# Patient Record
Sex: Male | Born: 1955 | Race: White | Hispanic: No | Marital: Married | Smoking: Never smoker
Health system: Southern US, Community
[De-identification: ages and names within clinical notes are randomized; demographics above are authoritative.]

## PROBLEM LIST (undated history)

## (undated) DIAGNOSIS — M5416 Radiculopathy, lumbar region: Secondary | ICD-10-CM

## (undated) DIAGNOSIS — R945 Abnormal results of liver function studies: Secondary | ICD-10-CM

## (undated) DIAGNOSIS — B351 Tinea unguium: Secondary | ICD-10-CM

## (undated) DIAGNOSIS — E785 Hyperlipidemia, unspecified: Secondary | ICD-10-CM

## (undated) DIAGNOSIS — R897 Abnormal histological findings in specimens from other organs, systems and tissues: Secondary | ICD-10-CM

## (undated) DIAGNOSIS — C801 Malignant (primary) neoplasm, unspecified: Secondary | ICD-10-CM

## (undated) DIAGNOSIS — R7989 Other specified abnormal findings of blood chemistry: Secondary | ICD-10-CM

## (undated) DIAGNOSIS — A6000 Herpesviral infection of urogenital system, unspecified: Secondary | ICD-10-CM

## (undated) DIAGNOSIS — K219 Gastro-esophageal reflux disease without esophagitis: Secondary | ICD-10-CM

## (undated) DIAGNOSIS — H8110 Benign paroxysmal vertigo, unspecified ear: Secondary | ICD-10-CM

## (undated) DIAGNOSIS — M7660 Achilles tendinitis, unspecified leg: Secondary | ICD-10-CM

## (undated) HISTORY — DX: Achilles tendinitis, unspecified leg: M76.60

## (undated) HISTORY — DX: Benign paroxysmal vertigo, unspecified ear: H81.10

## (undated) HISTORY — DX: Abnormal histological findings in specimens from other organs, systems and tissues: R89.7

## (undated) HISTORY — DX: Tinea unguium: B35.1

## (undated) HISTORY — DX: Abnormal results of liver function studies: R94.5

## (undated) HISTORY — PX: APPENDECTOMY: SHX54

## (undated) HISTORY — DX: Radiculopathy, lumbar region: M54.16

## (undated) HISTORY — DX: Other specified abnormal findings of blood chemistry: R79.89

## (undated) HISTORY — DX: Herpesviral infection of urogenital system, unspecified: A60.00

---

## 2007-10-25 DIAGNOSIS — H8110 Benign paroxysmal vertigo, unspecified ear: Secondary | ICD-10-CM

## 2007-10-25 HISTORY — DX: Benign paroxysmal vertigo, unspecified ear: H81.10

## 2009-01-23 ENCOUNTER — Emergency Department (HOSPITAL_BASED_OUTPATIENT_CLINIC_OR_DEPARTMENT_OTHER): Admission: EM | Admit: 2009-01-23 | Discharge: 2009-01-23 | Payer: Self-pay | Admitting: Emergency Medicine

## 2016-01-25 DIAGNOSIS — M5416 Radiculopathy, lumbar region: Secondary | ICD-10-CM | POA: Diagnosis not present

## 2016-01-25 DIAGNOSIS — M5126 Other intervertebral disc displacement, lumbar region: Secondary | ICD-10-CM | POA: Diagnosis not present

## 2016-02-02 DIAGNOSIS — M5126 Other intervertebral disc displacement, lumbar region: Secondary | ICD-10-CM | POA: Diagnosis not present

## 2016-02-02 DIAGNOSIS — M5416 Radiculopathy, lumbar region: Secondary | ICD-10-CM | POA: Diagnosis not present

## 2016-03-16 DIAGNOSIS — L821 Other seborrheic keratosis: Secondary | ICD-10-CM | POA: Diagnosis not present

## 2016-03-16 DIAGNOSIS — Z85828 Personal history of other malignant neoplasm of skin: Secondary | ICD-10-CM | POA: Diagnosis not present

## 2016-03-16 DIAGNOSIS — L814 Other melanin hyperpigmentation: Secondary | ICD-10-CM | POA: Diagnosis not present

## 2016-05-20 DIAGNOSIS — J301 Allergic rhinitis due to pollen: Secondary | ICD-10-CM | POA: Diagnosis not present

## 2016-05-20 DIAGNOSIS — J029 Acute pharyngitis, unspecified: Secondary | ICD-10-CM | POA: Diagnosis not present

## 2016-07-15 DIAGNOSIS — Z23 Encounter for immunization: Secondary | ICD-10-CM | POA: Diagnosis not present

## 2016-07-15 DIAGNOSIS — Z0000 Encounter for general adult medical examination without abnormal findings: Secondary | ICD-10-CM | POA: Diagnosis not present

## 2016-07-15 DIAGNOSIS — Z125 Encounter for screening for malignant neoplasm of prostate: Secondary | ICD-10-CM | POA: Diagnosis not present

## 2016-07-28 ENCOUNTER — Ambulatory Visit: Payer: Self-pay | Admitting: Allergy

## 2016-08-03 DIAGNOSIS — J029 Acute pharyngitis, unspecified: Secondary | ICD-10-CM | POA: Diagnosis not present

## 2016-08-03 DIAGNOSIS — R1313 Dysphagia, pharyngeal phase: Secondary | ICD-10-CM | POA: Diagnosis not present

## 2016-09-01 ENCOUNTER — Ambulatory Visit: Payer: Self-pay | Admitting: Allergy

## 2016-09-02 ENCOUNTER — Other Ambulatory Visit: Payer: Self-pay | Admitting: Gastroenterology

## 2016-11-07 DIAGNOSIS — M5416 Radiculopathy, lumbar region: Secondary | ICD-10-CM | POA: Diagnosis not present

## 2016-11-07 DIAGNOSIS — M545 Low back pain: Secondary | ICD-10-CM | POA: Diagnosis not present

## 2016-11-07 DIAGNOSIS — M5126 Other intervertebral disc displacement, lumbar region: Secondary | ICD-10-CM | POA: Diagnosis not present

## 2016-11-07 DIAGNOSIS — M47816 Spondylosis without myelopathy or radiculopathy, lumbar region: Secondary | ICD-10-CM | POA: Diagnosis not present

## 2016-11-15 ENCOUNTER — Ambulatory Visit (HOSPITAL_COMMUNITY)
Admission: RE | Admit: 2016-11-15 | Discharge: 2016-11-15 | Disposition: A | Discharge status: Home / Self Care | Payer: BLUE CROSS/BLUE SHIELD | Source: Ambulatory Visit | Attending: Gastroenterology | Admitting: Gastroenterology

## 2016-11-15 ENCOUNTER — Ambulatory Visit (HOSPITAL_COMMUNITY): Payer: BLUE CROSS/BLUE SHIELD | Admitting: Anesthesiology

## 2016-11-15 ENCOUNTER — Encounter (HOSPITAL_COMMUNITY)
Admission: RE | Disposition: A | Discharge status: Home / Self Care | Payer: Self-pay | Source: Ambulatory Visit | Attending: Gastroenterology

## 2016-11-15 ENCOUNTER — Encounter (HOSPITAL_COMMUNITY): Payer: Self-pay | Admitting: *Deleted

## 2016-11-15 DIAGNOSIS — H8110 Benign paroxysmal vertigo, unspecified ear: Secondary | ICD-10-CM | POA: Diagnosis not present

## 2016-11-15 DIAGNOSIS — Z1211 Encounter for screening for malignant neoplasm of colon: Secondary | ICD-10-CM | POA: Insufficient documentation

## 2016-11-15 HISTORY — PX: COLONOSCOPY WITH PROPOFOL: SHX5780

## 2016-11-15 SURGERY — COLONOSCOPY WITH PROPOFOL
Anesthesia: General

## 2016-11-15 MED ORDER — SODIUM CHLORIDE 0.9 % IV SOLN: INTRAVENOUS | Status: DC

## 2016-11-15 MED ORDER — LACTATED RINGERS IV SOLN
INTRAVENOUS | Status: DC
  Administered 2016-11-15: 1000 mL via INTRAVENOUS

## 2016-11-15 MED ORDER — PROPOFOL 500 MG/50ML IV EMUL
INTRAVENOUS | Status: DC | PRN
  Administered 2016-11-15: 150 ug/kg/min via INTRAVENOUS

## 2016-11-15 MED ORDER — PROPOFOL 500 MG/50ML IV EMUL
INTRAVENOUS | Status: DC | PRN
  Administered 2016-11-15: 50 mg via INTRAVENOUS

## 2016-11-15 MED ORDER — PROPOFOL 10 MG/ML IV BOLUS
INTRAVENOUS | Status: AC
  Filled 2016-11-15: qty 40

## 2016-11-15 SURGICAL SUPPLY — 22 items

## 2016-11-15 NOTE — Anesthesia Preprocedure Evaluation (Addendum)
Anesthesia Evaluation  Patient identified by MRN, date of birth, ID band Patient awake    Reviewed: Allergy & Precautions, NPO status , Patient's Chart, lab work & pertinent test results  Airway Mallampati: II  TM Distance: >3 FB Neck ROM: Full    Dental  (+) Teeth Intact   Pulmonary neg pulmonary ROS,    breath sounds clear to auscultation       Cardiovascular negative cardio ROS   Rhythm:Regular     Neuro/Psych Neuro pain on steroid pulse.  Neuromuscular disease negative psych ROS   GI/Hepatic negative GI ROS, Neg liver ROS,   Endo/Other  negative endocrine ROS  Renal/GU negative Renal ROS  negative genitourinary   Musculoskeletal negative musculoskeletal ROS (+)   Abdominal   Peds negative pediatric ROS (+)  Hematology negative hematology ROS (+)   Anesthesia Other Findings   Reproductive/Obstetrics negative OB ROS                             Anesthesia Physical Anesthesia Plan  ASA: I  Anesthesia Plan: MAC   Post-op Pain Management:    Induction: Intravenous  Airway Management Planned: Natural Airway, Simple Face Mask and Nasal Cannula  Additional Equipment: None  Intra-op Plan:   Post-operative Plan:   Informed Consent: I have reviewed the patients History and Physical, chart, labs and discussed the procedure including the risks, benefits and alternatives for the proposed anesthesia with the patient or authorized representative who has indicated his/her understanding and acceptance.   Dental advisory given  Plan Discussed with: CRNA and Surgeon  Anesthesia Plan Comments:         Anesthesia Quick Evaluation

## 2016-11-15 NOTE — H&P (Signed)
Procedure: Screening colonoscopy. Normal screening colonoscopy was performed on 07/13/2006  History: The patient is a 61 year old male born 03/25/1956. He is scheduled to undergo a screening colonoscopy today  Past medical history: Appendectomy. Lumbar radiculopathy. Benign paroxysmal positional vertigo.  Exam: The patient is alert and lying comfortably on the endoscopy stretcher. Abdomen is soft and nontender to palpation. Lungs are clear to auscultation. Cardiac exam reveals a regular rhythm.  Plan: Proceed with screening colonoscopy

## 2016-11-15 NOTE — Discharge Instructions (Signed)

## 2016-11-15 NOTE — Transfer of Care (Signed)
Immediate Anesthesia Transfer of Care Note  Patient: Cameron Campbell  Procedure(s) Performed: Procedure(s): COLONOSCOPY WITH PROPOFOL (N/A)  Patient Location: Endoscopy Unit  Anesthesia Type:MAC  Level of Consciousness: awake and alert   Airway & Oxygen Therapy: Patient Spontanous Breathing and Patient connected to face mask oxygen  Post-op Assessment: Report given to RN and Post -op Vital signs reviewed and stable  Post vital signs: Reviewed and stable  Last Vitals:  Vitals:   11/15/16 0930  BP: 120/66  Pulse: 64  Resp: 12  Temp: 36.6 C    Last Pain:  Vitals:   11/15/16 0930  TempSrc: Oral         Complications: No apparent anesthesia complications

## 2016-11-15 NOTE — Anesthesia Postprocedure Evaluation (Addendum)
Anesthesia Post Note  Patient: Cameron Campbell  Procedure(s) Performed: Procedure(s) (LRB): COLONOSCOPY WITH PROPOFOL (N/A)  Patient location during evaluation: Endoscopy Anesthesia Type: General Level of consciousness: awake and alert Pain management: pain level controlled Vital Signs Assessment: post-procedure vital signs reviewed and stable Respiratory status: spontaneous breathing, nonlabored ventilation, respiratory function stable and patient connected to nasal cannula oxygen Cardiovascular status: blood pressure returned to baseline and stable Postop Assessment: no signs of nausea or vomiting Anesthetic complications: no       Last Vitals:  Vitals:   11/15/16 0930 11/15/16 1110  BP: 120/66 131/76  Pulse: 64 83  Resp: 12 19  Temp: 36.6 C     Last Pain:  Vitals:   11/15/16 0930  TempSrc: Oral                 Berel Najjar

## 2016-11-15 NOTE — Op Note (Signed)
[facility] Patient Name: Cameron Campbell Procedure Date: 11/15/2016 MRN: [id number] Attending MD: Charolett Bumpers , MD Date of Birth: [birth year] CSN: [id number] Age: 61 Admit Type: Outpatient Procedure:                Colonoscopy Indications:              Screening for colorectal malignant neoplasm Providers:                Charolett Bumpers, MD, Omelia Blackwater RN, RN,                            [facility], Technician, Roni Bread,                            CRNA Referring MD:              Medicines:                Propofol per Anesthesia Complications:            No immediate complications. Estimated Blood Loss:     Estimated blood loss: none. Procedure:                Pre-Anesthesia Assessment:                           - Prior to the procedure, a History and Physical                            was performed, and patient medications and                            allergies were reviewed. The patient's tolerance of                            previous anesthesia was also reviewed. The risks                            and benefits of the procedure and the sedation                            options and risks were discussed with the patient.                            All questions were answered, and informed consent                            was obtained. Prior Anticoagulants: The patient has                            taken no previous anticoagulant or antiplatelet                            agents. ASA Grade Assessment: II - A patient with  mild systemic disease. After reviewing the risks                            and benefits, the patient was deemed in                            satisfactory condition to undergo the procedure.                           After obtaining informed consent, the colonoscope                            was passed under direct vision. Throughout the                            procedure, the  patient's blood pressure, pulse, and                            oxygen saturations were monitored continuously. The                            EC-[id number] ([id number] scope was introduced through                            the anus and advanced to the the cecum, identified                            by appendiceal orifice and ileocecal valve. The                            colonoscopy was somewhat difficult due to                            significant looping. The patient tolerated the                            procedure well. The quality of the bowel                            preparation was good. The appendiceal orifice and                            the rectum were photographed. Scope In: 10:32:32 AM Scope Out: 11:00:21 AM Scope Withdrawal Time: 0 hours 7 minutes 24 seconds  Total Procedure Duration: 0 hours 27 minutes 49 seconds  Findings:      The perianal and digital rectal examinations were normal.      The entire examined colon appeared normal. Impression:               - The entire examined colon is normal.                           - No specimens collected. Moderate Sedation:      N/A- Per Anesthesia Care Recommendation:           -  Patient has a contact number available for                            emergencies. The signs and symptoms of potential                            delayed complications were discussed with the                            patient. Return to normal activities tomorrow.                            Written discharge instructions were provided to the                            patient.                           - Repeat colonoscopy in 10 years for screening                            purposes.                           - Resume previous diet.                           - Continue present medications. Procedure Code(s):        --- Professional ---                           Z6109, Colorectal cancer screening; colonoscopy on                             individual not meeting criteria for high risk Diagnosis Code(s):        --- Professional ---                           Z12.11, Encounter for screening for malignant                            neoplasm of colon CPT copyright 2016 American Medical Association. All rights reserved. The codes documented in this report are preliminary and upon coder review may  be revised to meet current compliance requirements. Danise Edge, MD Charolett Bumpers, MD 11/15/2016 11:10:46 AM This report has been signed electronically. Number of Addenda: 0

## 2016-11-16 ENCOUNTER — Encounter (HOSPITAL_COMMUNITY): Payer: Self-pay | Admitting: Gastroenterology

## 2016-11-17 DIAGNOSIS — M5416 Radiculopathy, lumbar region: Secondary | ICD-10-CM | POA: Diagnosis not present

## 2016-11-17 DIAGNOSIS — M5126 Other intervertebral disc displacement, lumbar region: Secondary | ICD-10-CM | POA: Diagnosis not present

## 2016-11-17 DIAGNOSIS — M545 Low back pain: Secondary | ICD-10-CM | POA: Diagnosis not present

## 2016-11-22 DIAGNOSIS — M5126 Other intervertebral disc displacement, lumbar region: Secondary | ICD-10-CM | POA: Diagnosis not present

## 2016-11-22 DIAGNOSIS — M545 Low back pain: Secondary | ICD-10-CM | POA: Diagnosis not present

## 2016-11-22 DIAGNOSIS — M5416 Radiculopathy, lumbar region: Secondary | ICD-10-CM | POA: Diagnosis not present

## 2016-12-02 DIAGNOSIS — M545 Low back pain: Secondary | ICD-10-CM | POA: Diagnosis not present

## 2016-12-02 DIAGNOSIS — M5416 Radiculopathy, lumbar region: Secondary | ICD-10-CM | POA: Diagnosis not present

## 2016-12-02 DIAGNOSIS — M5126 Other intervertebral disc displacement, lumbar region: Secondary | ICD-10-CM | POA: Diagnosis not present

## 2016-12-08 DIAGNOSIS — M545 Low back pain: Secondary | ICD-10-CM | POA: Diagnosis not present

## 2016-12-08 DIAGNOSIS — M5126 Other intervertebral disc displacement, lumbar region: Secondary | ICD-10-CM | POA: Diagnosis not present

## 2016-12-08 DIAGNOSIS — M5416 Radiculopathy, lumbar region: Secondary | ICD-10-CM | POA: Diagnosis not present

## 2016-12-14 DIAGNOSIS — M545 Low back pain: Secondary | ICD-10-CM | POA: Diagnosis not present

## 2016-12-14 DIAGNOSIS — M5416 Radiculopathy, lumbar region: Secondary | ICD-10-CM | POA: Diagnosis not present

## 2016-12-14 DIAGNOSIS — M5126 Other intervertebral disc displacement, lumbar region: Secondary | ICD-10-CM | POA: Diagnosis not present

## 2016-12-20 DIAGNOSIS — M545 Low back pain: Secondary | ICD-10-CM | POA: Diagnosis not present

## 2016-12-20 DIAGNOSIS — M5126 Other intervertebral disc displacement, lumbar region: Secondary | ICD-10-CM | POA: Diagnosis not present

## 2016-12-20 DIAGNOSIS — M5416 Radiculopathy, lumbar region: Secondary | ICD-10-CM | POA: Diagnosis not present

## 2017-01-19 DIAGNOSIS — M5126 Other intervertebral disc displacement, lumbar region: Secondary | ICD-10-CM | POA: Diagnosis not present

## 2017-01-19 DIAGNOSIS — M5416 Radiculopathy, lumbar region: Secondary | ICD-10-CM | POA: Diagnosis not present

## 2017-02-06 DIAGNOSIS — M5416 Radiculopathy, lumbar region: Secondary | ICD-10-CM | POA: Diagnosis not present

## 2017-02-06 DIAGNOSIS — M5126 Other intervertebral disc displacement, lumbar region: Secondary | ICD-10-CM | POA: Diagnosis not present

## 2017-02-06 DIAGNOSIS — M545 Low back pain: Secondary | ICD-10-CM | POA: Diagnosis not present

## 2017-03-15 DIAGNOSIS — M5416 Radiculopathy, lumbar region: Secondary | ICD-10-CM | POA: Diagnosis not present

## 2017-03-15 DIAGNOSIS — M545 Low back pain: Secondary | ICD-10-CM | POA: Diagnosis not present

## 2017-03-15 DIAGNOSIS — M5126 Other intervertebral disc displacement, lumbar region: Secondary | ICD-10-CM | POA: Diagnosis not present

## 2017-03-15 DIAGNOSIS — M4807 Spinal stenosis, lumbosacral region: Secondary | ICD-10-CM | POA: Diagnosis not present

## 2017-03-15 DIAGNOSIS — M4120 Other idiopathic scoliosis, site unspecified: Secondary | ICD-10-CM | POA: Diagnosis not present

## 2017-03-27 NOTE — Addendum Note (Signed)
Addendum  created 03/27/17 1020 by Chantel Teti, MD   Sign clinical note    

## 2017-04-03 ENCOUNTER — Other Ambulatory Visit: Payer: Self-pay | Admitting: Neurosurgery

## 2017-04-03 DIAGNOSIS — M5416 Radiculopathy, lumbar region: Secondary | ICD-10-CM

## 2017-04-17 ENCOUNTER — Other Ambulatory Visit: Payer: BLUE CROSS/BLUE SHIELD

## 2017-06-29 DIAGNOSIS — H6593 Unspecified nonsuppurative otitis media, bilateral: Secondary | ICD-10-CM | POA: Diagnosis not present

## 2017-06-29 DIAGNOSIS — J301 Allergic rhinitis due to pollen: Secondary | ICD-10-CM | POA: Diagnosis not present

## 2017-07-08 DIAGNOSIS — S43109A Unspecified dislocation of unspecified acromioclavicular joint, initial encounter: Secondary | ICD-10-CM | POA: Diagnosis not present

## 2017-07-19 DIAGNOSIS — Z125 Encounter for screening for malignant neoplasm of prostate: Secondary | ICD-10-CM | POA: Diagnosis not present

## 2017-07-19 DIAGNOSIS — Z23 Encounter for immunization: Secondary | ICD-10-CM | POA: Diagnosis not present

## 2017-07-19 DIAGNOSIS — R7303 Prediabetes: Secondary | ICD-10-CM | POA: Diagnosis not present

## 2017-07-19 DIAGNOSIS — Z0000 Encounter for general adult medical examination without abnormal findings: Secondary | ICD-10-CM | POA: Diagnosis not present

## 2017-08-16 DIAGNOSIS — M545 Low back pain: Secondary | ICD-10-CM | POA: Diagnosis not present

## 2017-08-16 DIAGNOSIS — S233XXA Sprain of ligaments of thoracic spine, initial encounter: Secondary | ICD-10-CM | POA: Diagnosis not present

## 2017-11-09 DIAGNOSIS — F4323 Adjustment disorder with mixed anxiety and depressed mood: Secondary | ICD-10-CM | POA: Diagnosis not present

## 2017-11-16 DIAGNOSIS — H2513 Age-related nuclear cataract, bilateral: Secondary | ICD-10-CM | POA: Diagnosis not present

## 2017-12-07 ENCOUNTER — Encounter: Payer: Self-pay | Admitting: Interventional Cardiology

## 2017-12-07 DIAGNOSIS — R0609 Other forms of dyspnea: Secondary | ICD-10-CM | POA: Diagnosis not present

## 2017-12-07 DIAGNOSIS — M2669 Other specified disorders of temporomandibular joint: Secondary | ICD-10-CM | POA: Diagnosis not present

## 2017-12-07 DIAGNOSIS — E785 Hyperlipidemia, unspecified: Secondary | ICD-10-CM | POA: Diagnosis not present

## 2017-12-08 ENCOUNTER — Other Ambulatory Visit: Payer: Self-pay | Admitting: Internal Medicine

## 2017-12-08 DIAGNOSIS — R0600 Dyspnea, unspecified: Secondary | ICD-10-CM

## 2017-12-08 DIAGNOSIS — R0609 Other forms of dyspnea: Principal | ICD-10-CM

## 2017-12-19 ENCOUNTER — Ambulatory Visit (INDEPENDENT_AMBULATORY_CARE_PROVIDER_SITE_OTHER): Payer: BLUE CROSS/BLUE SHIELD

## 2017-12-19 DIAGNOSIS — R0609 Other forms of dyspnea: Secondary | ICD-10-CM | POA: Diagnosis not present

## 2017-12-19 DIAGNOSIS — R0600 Dyspnea, unspecified: Secondary | ICD-10-CM

## 2017-12-19 LAB — EXERCISE TOLERANCE TEST
CHL CUP MPHR: 158 {beats}/min
CHL CUP RESTING HR STRESS: 65 {beats}/min
CHL RATE OF PERCEIVED EXERTION: 17
CSEPEDS: 0 s
Estimated workload: 10.1 METS
Exercise duration (min): 9 min
Peak HR: 142 {beats}/min
Percent HR: 89 %

## 2018-01-01 ENCOUNTER — Ambulatory Visit: Payer: BLUE CROSS/BLUE SHIELD | Admitting: Cardiology

## 2018-01-18 ENCOUNTER — Encounter: Payer: Self-pay | Admitting: Interventional Cardiology

## 2018-01-18 ENCOUNTER — Encounter (INDEPENDENT_AMBULATORY_CARE_PROVIDER_SITE_OTHER): Payer: Self-pay

## 2018-01-18 ENCOUNTER — Ambulatory Visit: Payer: BLUE CROSS/BLUE SHIELD | Admitting: Interventional Cardiology

## 2018-01-18 VITALS — BP 92/60 | HR 67 | Ht 71.0 in | Wt 171.8 lb

## 2018-01-18 DIAGNOSIS — E782 Mixed hyperlipidemia: Secondary | ICD-10-CM

## 2018-01-18 DIAGNOSIS — R9439 Abnormal result of other cardiovascular function study: Secondary | ICD-10-CM

## 2018-01-18 NOTE — Patient Instructions (Signed)

## 2018-01-18 NOTE — Progress Notes (Signed)
Cardiology Office Note   Date:  01/18/2018   ID:  Cameron Spitzimothy J Heiss, DOB 06/11/1956, MRN [id number]  PCP:  Lorenda IshiharaVaradarajan, Rupashree, MD    No chief complaint on file.  abnormal stress test  Wt Readings from Last 3 Encounters:  01/18/18 171 lb 12.8 oz (77.9 kg)  11/15/16 180 lb (81.6 kg)       History of Present Illness: Cameron Campbell is a 62 y.o. male who is being seen today for the evaluation of abnormal stress test at the request of Lorenda IshiharaVaradarajan, Rupashree,*.  He had some left jaw pain after a skiing trip in [place].  He thinks he fell on his face prior to the pain starting.  He came back and saw his PMD.  His lipids were checked and his cholesterol had increased.  This prompted a treadmill test which was abnormal.    Currently, he feels well.  No further jaw pain.  Denies : Chest pain. Dizziness. Leg edema. Nitroglycerin use. Orthopnea. Palpitations. Paroxysmal nocturnal dyspnea. Shortness of breath. Syncope.   He has occasional leg cramps.    He walks daily, about 2 miles, at least 3 x/week.  He walks 10K steps/day.  No issues.      Past Medical History:  Diagnosis Date  . Abnormal prostate biopsy    negative bx 2003  . Achilles tendinitis   . BPPV (benign paroxysmal positional vertigo) 10/2007  . Elevated LFTs   . Genital herpes   . Lumbar radiculopathy   . Onychomycosis     Past Surgical History:  Procedure Laterality Date  . APPENDECTOMY    . COLONOSCOPY WITH PROPOFOL N/A 11/15/2016   Procedure: COLONOSCOPY WITH PROPOFOL;  Surgeon: Charolett BumpersMartin K Johnson, MD;  Location: [facility];  Service: Endoscopy;  Laterality: N/A;     Current Outpatient Medications  Medication Sig Dispense Refill  . atorvastatin (LIPITOR) 10 MG tablet Take 10 mg by mouth daily.  1  . Multiple Vitamin (MULTIVITAMIN WITH MINERALS) TABS tablet Take 1 tablet by mouth daily.    . predniSONE (STERAPRED UNI-PAK 48 TAB) 10 MG (48) TBPK tablet Take 10 tablets by mouth daily.     No current  facility-administered medications for this visit.     Allergies:   Patient has no known allergies.    Social History:  The patient  reports that he has never smoked. He has never used smokeless tobacco. He reports that he drinks alcohol. He reports that he does not use drugs.   Family History:  The patient's family history includes AAA (abdominal aortic aneurysm) in his father; CAD in his father; Crohn's disease in his brother and brother; Healthy in his brother, brother, sister, and sister; Heart attack in his father; Myelodysplastic syndrome in his mother; Non-Hodgkin's lymphoma in his mother; Other in his father.    ROS:  Please see the history of present illness.   Otherwise, review of systems are positive for leg cramps.   All other systems are reviewed and negative.    PHYSICAL EXAM: VS:  BP 92/60   Pulse 67   Ht 5\' 11"  (1.803 m)   Wt 171 lb 12.8 oz (77.9 kg)   SpO2 98%   BMI 23.96 kg/m  , BMI Body mass index is 23.96 kg/m. GEN: Well nourished, well developed, in no acute distress  HEENT: normal  Neck: no JVD, carotid bruits, or masses Cardiac: RRR; no murmurs, rubs, or gallops,no edema ; 2+ posterior tibial pulses bilaterally Respiratory:  clear to auscultation  bilaterally, normal work of breathing GI: soft, nontender, nondistended, + BS MS: no deformity or atrophy  Skin: warm and dry, no rash Neuro:  Strength and sensation are intact Psych: euthymic mood, full affect   EKG:   The ekg ordered 12/07/17 demonstrates normal ECG   Recent Labs: No results found for requested labs within last 8760 hours.   Lipid Panel No results found for: CHOL, TRIG, HDL, CHOLHDL, VLDL, LDLCALC, LDLDIRECT   Other studies Reviewed: Additional studies/ records that were reviewed today with results demonstrating: stress test reviewed.  Lipids reviewed   ASSESSMENT AND PLAN:  1. Abnormal stress test: Upsloping ST depression noted.  THis may not be a sign of ischemia, as it was 9  minutes on the treadmill without sx.  No downsloping ST depression noted.  He has not had any further sx.  Jaw pain has resolved.  No exertional sx.  We discussed cath and nuclear stress as options.  Since he feels better, he prefers to hold off and see if any symptoms occur.  I think this is reasonable.  We did talk about modifying risk factors including lipid-lowering therapy, improving his diet and continuing regular exercise. 2. Hyperlipidemia: LDL 166.  Would like to see it < 130.  COntinue atorvastatin. 3. Family history of coronary artery disease.  Father with prior vascular disease. 4. He will let us know if there is any change in his symptoms.  I did explain to him that if he had symptoms concerning for ischemia, we would either schedule a nuclear stress test or potentially an angiogram.  This would be done quickly if he had any symptoms.   Current medicines are reviewed at length with the patient today.  The patient concerns regarding his medicines were addressed.  The following changes have been made:  No change  Labs/ tests ordered today include:  No orders of the defined types were placed in this encounter.   Recommend 150 minutes/week of aerobic exercise Low fat, low carb, high fiber diet recommended  Disposition:   FU in 6 months   Signed, Lance Muss, MD  01/18/2018 9:24 AM    [facility] [address] [place], [place], [place]  [address] Phone: [phone]; Fax: [phone]

## 2018-02-07 ENCOUNTER — Ambulatory Visit: Payer: BLUE CROSS/BLUE SHIELD | Admitting: Cardiovascular Disease

## 2018-03-06 DIAGNOSIS — S83241A Other tear of medial meniscus, current injury, right knee, initial encounter: Secondary | ICD-10-CM | POA: Diagnosis not present

## 2018-07-02 DIAGNOSIS — J309 Allergic rhinitis, unspecified: Secondary | ICD-10-CM | POA: Diagnosis not present

## 2018-07-02 DIAGNOSIS — R498 Other voice and resonance disorders: Secondary | ICD-10-CM | POA: Diagnosis not present

## 2018-07-02 DIAGNOSIS — Z0001 Encounter for general adult medical examination with abnormal findings: Secondary | ICD-10-CM | POA: Diagnosis not present

## 2018-07-02 DIAGNOSIS — F4320 Adjustment disorder, unspecified: Secondary | ICD-10-CM | POA: Diagnosis not present

## 2018-07-11 DIAGNOSIS — Z136 Encounter for screening for cardiovascular disorders: Secondary | ICD-10-CM | POA: Diagnosis not present

## 2018-07-11 DIAGNOSIS — Z0000 Encounter for general adult medical examination without abnormal findings: Secondary | ICD-10-CM | POA: Diagnosis not present

## 2018-07-12 DIAGNOSIS — R040 Epistaxis: Secondary | ICD-10-CM | POA: Diagnosis not present

## 2018-07-19 ENCOUNTER — Ambulatory Visit: Payer: BLUE CROSS/BLUE SHIELD | Admitting: Interventional Cardiology

## 2018-07-20 DIAGNOSIS — Z125 Encounter for screening for malignant neoplasm of prostate: Secondary | ICD-10-CM | POA: Diagnosis not present

## 2018-07-20 DIAGNOSIS — Z0000 Encounter for general adult medical examination without abnormal findings: Secondary | ICD-10-CM | POA: Diagnosis not present

## 2018-07-20 DIAGNOSIS — Z23 Encounter for immunization: Secondary | ICD-10-CM | POA: Diagnosis not present

## 2018-07-20 DIAGNOSIS — E785 Hyperlipidemia, unspecified: Secondary | ICD-10-CM | POA: Diagnosis not present

## 2018-08-20 ENCOUNTER — Ambulatory Visit: Payer: BLUE CROSS/BLUE SHIELD | Admitting: Cardiology

## 2018-08-20 ENCOUNTER — Encounter: Payer: Self-pay | Admitting: Cardiology

## 2018-08-20 VITALS — BP 116/72 | HR 68 | Ht 71.0 in | Wt 181.0 lb

## 2018-08-20 DIAGNOSIS — R9439 Abnormal result of other cardiovascular function study: Secondary | ICD-10-CM | POA: Diagnosis not present

## 2018-08-20 NOTE — Patient Instructions (Addendum)
  Medication Instructions:  none If you need a refill on your cardiac medications before your next appointment, please call your pharmacy.   Lab work: none If you have labs (blood work) drawn today and your tests are completely normal, you will receive your results only by: Marland Kitchen MyChart Message (if you have MyChart) OR . A paper copy in the mail If you have any lab test that is abnormal or we need to change your treatment, we will call you to review the results.  Testing/Procedures: none  Follow-Up: At [facility], you and your health needs are our priority.  As part of our continuing mission to provide you with exceptional heart care, we have created designated Provider Care Teams.  These Care Teams include your primary Cardiologist (physician) and Advanced Practice Providers (APPs -  Physician Assistants and Nurse Practitioners) who all work together to provide you with the care you need, when you need it. You will need a follow up appointment as needed. Please call our office 2 months in advance to schedule this appointment.  You may see Dr Eldridge Dace or one of the following Advanced Practice Providers on your designated Care Team:   Robbie Lis, PA-C Ronie Spies, PA-C . Jacolyn Reedy, PA-C  Any Other Special Instructions Will Be Listed Below (If Applicable).

## 2018-08-20 NOTE — Progress Notes (Signed)
08/20/2018 Cameron Campbell   11-Jul-1956  [id number]  Primary Physician Lorenda Ishihara, MD Primary Cardiologist: Dr. Eldridge Dace   Reason for Visit/CC: f/u for abnormal Exercise stress test.   HPI:  Cameron Campbell is a 62 y.o. male who is being seen today for f/u.   He was initially referred to Dr. Eldridge Dace in March of this year by his PCP for evaluation for abnormal stress test.  Records indicate that he had developed left jaw pain during a skiing trip in [place].  The patient had recalled that he had fell on his face prior to the pain starting.  When he returned home, he set up an appointment with his PCP.  His lipids were checked and his cholesterol had increased.  This prompted a treadmill test which was interpreted by PCP to be abnormal.  He was referred to Dr. Eldridge Dace for further evaluation.  His EKGs were reviewed and demonstrated up sloping ST depressions.  Dr. Eldridge Dace did not feel that this was a sign of ischemia as he was able to exercise for more than 9 minutes on the treadmill without symptoms.  There was no downsloping ST depressions noted.  When the patient was seen in clinic, he had denied any further symptoms.  His jaw pain had resolved and he denied any exertional chest pain or dyspnea.  Dr. Eldridge Dace discussed cardiac catheterization and nuclear stress test as options, however since the patient was feeling better at that time he noted that he would prefer to hold off and see if any symptoms would occur.  Dr. Eldridge Dace felt that this was reasonable.  He did recommend risk factor modification including lipid-lowering therapy to reduce LDL.  His LDL was noted to be 166 mg/dL.  Dr. Eldridge Dace stated that he would like to see this less than 130 mg/dL.  He presents back to clinic today for follow-up. He has done well. He has not had any recurrent jaw pain and no CP or exertional dyspnea. He walks 2 miles a day w/o exertional symptoms. EKG shows NSR with no ischemic abnormalities. He  is now on a statin, Lipitor, and LDL is now under good control at 91 mg/dL. He does have prediabetes with Hgb A1c at 5.7. He denies tobacco use. BP is well controlled w/o any medications.   Current Meds  Medication Sig  . atorvastatin (LIPITOR) 10 MG tablet Take 10 mg by mouth daily.  . Multiple Vitamin (MULTIVITAMIN WITH MINERALS) TABS tablet Take 1 tablet by mouth daily.   No Known Allergies Past Medical History:  Diagnosis Date  . Abnormal prostate biopsy    negative bx 2003  . Achilles tendinitis   . BPPV (benign paroxysmal positional vertigo) 10/2007  . Elevated LFTs   . Genital herpes   . Lumbar radiculopathy   . Onychomycosis    Family History  Problem Relation Age of Onset  . Non-Hodgkin's lymphoma Mother   . Myelodysplastic syndrome Mother   . Heart attack Father   . AAA (abdominal aortic aneurysm) Father        stented  . CAD Father   . Other Father        pacer  . Healthy Sister   . Healthy Brother   . Healthy Brother   . Crohn's disease Brother   . Crohn's disease Brother   . Healthy Sister    Past Surgical History:  Procedure Laterality Date  . APPENDECTOMY    . COLONOSCOPY WITH PROPOFOL N/A 11/15/2016   Procedure: COLONOSCOPY  WITH PROPOFOL;  Surgeon: Charolett Bumpers, MD;  Location: [facility];  Service: Endoscopy;  Laterality: N/A;   Social History   Socioeconomic History  . Marital status: Married    Spouse name: Not on file  . Number of children: Not on file  . Years of education: Not on file  . Highest education level: Not on file  Occupational History  . Not on file  Social Needs  . Financial resource strain: Not on file  . Food insecurity:    Worry: Not on file    Inability: Not on file  . Transportation needs:    Medical: Not on file    Non-medical: Not on file  Tobacco Use  . Smoking status: Never Smoker  . Smokeless tobacco: Never Used  Substance and Sexual Activity  . Alcohol use: Yes    Comment: occasional  . Drug use: No  .  Sexual activity: Not on file  Lifestyle  . Physical activity:    Days per week: Not on file    Minutes per session: Not on file  . Stress: Not on file  Relationships  . Social connections:    Talks on phone: Not on file    Gets together: Not on file    Attends religious service: Not on file    Active member of club or organization: Not on file    Attends meetings of clubs or organizations: Not on file    Relationship status: Not on file  . Intimate partner violence:    Fear of current or ex partner: Not on file    Emotionally abused: Not on file    Physically abused: Not on file    Forced sexual activity: Not on file  Other Topics Concern  . Not on file  Social History Narrative  . Not on file     Review of Systems: General: negative for chills, fever, night sweats or weight changes.  Cardiovascular: negative for chest pain, dyspnea on exertion, edema, orthopnea, palpitations, paroxysmal nocturnal dyspnea or shortness of breath Dermatological: negative for rash Respiratory: negative for cough or wheezing Urologic: negative for hematuria Abdominal: negative for nausea, vomiting, diarrhea, bright red blood per rectum, melena, or hematemesis Neurologic: negative for visual changes, syncope, or dizziness All other systems reviewed and are otherwise negative except as noted above.   Physical Exam:  Blood pressure 116/72, pulse 68, height 5\' 11"  (1.803 m), weight 181 lb (82.1 kg), SpO2 96 %.  General appearance: alert, cooperative and no distress Neck: no carotid bruit and no JVD Lungs: clear to auscultation bilaterally Heart: regular rate and rhythm, S1, S2 normal, no murmur, click, rub or gallop Extremities: extremities normal, atraumatic, no cyanosis or edema Pulses: 2+ and symmetric Skin: Skin color, texture, turgor normal. No rashes or lesions Neurologic: Grossly normal  EKG NSR 68 bpm no ischemia-- personally reviewed   ASSESSMENT AND PLAN:   [name] has had no  recurrent jaw pain since his ski trip.  In hindsight, he believes that the pain was related to him falling on his face while skiing. His Exercise stress test done in March was not concerning for ischemia as outlined above.  He continues to deny any exertional symptoms.  He is very active. He walks 2 miles a day for exercise and has not experienced any exertional jaw pain, no exertional chest pain or exertional dyspnea.  His cardiac risk factors are controlled.  His LDL is now down to 91 mg/dL on statin therapy.  He  does have prediabetes with last hemoglobin A1c of 5.7.  This will need to be followed by his PCP.  His blood pressure is well controlled without medications and he does not use tobacco.  We discussed early warning signs for coronary artery disease including exertional chest pain and dyspnea.  I have encouraged patient to continue with daily use of statin to keep LDL under good control and also encouraged that he continue to exercise regularly.  He will notify us if he develops any exertional chest pain or dyspnea.  Otherwise, I recommend routine follow-up with his PCP for management of his cardiac risk factors and for surveillance.  He can follow-up with cardiology as needed  Follow-Up PRN  Nekia Maxham Delmer Islam, MHS [facility] 08/20/2018 4:28 PM

## 2019-07-30 DIAGNOSIS — R040 Epistaxis: Secondary | ICD-10-CM | POA: Diagnosis not present

## 2019-10-07 DIAGNOSIS — Z23 Encounter for immunization: Secondary | ICD-10-CM | POA: Diagnosis not present

## 2019-10-31 DIAGNOSIS — Z125 Encounter for screening for malignant neoplasm of prostate: Secondary | ICD-10-CM | POA: Diagnosis not present

## 2019-10-31 DIAGNOSIS — E785 Hyperlipidemia, unspecified: Secondary | ICD-10-CM | POA: Diagnosis not present

## 2019-10-31 DIAGNOSIS — Z79899 Other long term (current) drug therapy: Secondary | ICD-10-CM | POA: Diagnosis not present

## 2019-10-31 DIAGNOSIS — Z0000 Encounter for general adult medical examination without abnormal findings: Secondary | ICD-10-CM | POA: Diagnosis not present

## 2019-11-25 DIAGNOSIS — M5412 Radiculopathy, cervical region: Secondary | ICD-10-CM | POA: Diagnosis not present

## 2019-11-25 DIAGNOSIS — M7581 Other shoulder lesions, right shoulder: Secondary | ICD-10-CM | POA: Diagnosis not present

## 2019-11-25 DIAGNOSIS — M545 Low back pain: Secondary | ICD-10-CM | POA: Diagnosis not present

## 2019-12-02 DIAGNOSIS — M542 Cervicalgia: Secondary | ICD-10-CM | POA: Diagnosis not present

## 2019-12-11 DIAGNOSIS — R030 Elevated blood-pressure reading, without diagnosis of hypertension: Secondary | ICD-10-CM | POA: Diagnosis not present

## 2019-12-11 DIAGNOSIS — M4802 Spinal stenosis, cervical region: Secondary | ICD-10-CM | POA: Diagnosis not present

## 2019-12-11 DIAGNOSIS — M5412 Radiculopathy, cervical region: Secondary | ICD-10-CM | POA: Diagnosis not present

## 2019-12-11 DIAGNOSIS — M5020 Other cervical disc displacement, unspecified cervical region: Secondary | ICD-10-CM | POA: Diagnosis not present

## 2019-12-16 DIAGNOSIS — M5126 Other intervertebral disc displacement, lumbar region: Secondary | ICD-10-CM | POA: Diagnosis not present

## 2019-12-18 DIAGNOSIS — Z1152 Encounter for screening for COVID-19: Secondary | ICD-10-CM | POA: Diagnosis not present

## 2019-12-23 DIAGNOSIS — M4802 Spinal stenosis, cervical region: Secondary | ICD-10-CM | POA: Diagnosis not present

## 2019-12-23 DIAGNOSIS — M5020 Other cervical disc displacement, unspecified cervical region: Secondary | ICD-10-CM | POA: Diagnosis not present

## 2019-12-23 DIAGNOSIS — M5412 Radiculopathy, cervical region: Secondary | ICD-10-CM | POA: Diagnosis not present

## 2019-12-23 DIAGNOSIS — M542 Cervicalgia: Secondary | ICD-10-CM | POA: Diagnosis not present

## 2019-12-24 DIAGNOSIS — M4722 Other spondylosis with radiculopathy, cervical region: Secondary | ICD-10-CM | POA: Diagnosis not present

## 2019-12-24 DIAGNOSIS — M4712 Other spondylosis with myelopathy, cervical region: Secondary | ICD-10-CM | POA: Diagnosis not present

## 2019-12-24 DIAGNOSIS — M50323 Other cervical disc degeneration at C6-C7 level: Secondary | ICD-10-CM | POA: Diagnosis not present

## 2019-12-24 DIAGNOSIS — M50223 Other cervical disc displacement at C6-C7 level: Secondary | ICD-10-CM | POA: Diagnosis not present

## 2019-12-24 DIAGNOSIS — M50123 Cervical disc disorder at C6-C7 level with radiculopathy: Secondary | ICD-10-CM | POA: Diagnosis not present

## 2019-12-24 DIAGNOSIS — M50122 Cervical disc disorder at C5-C6 level with radiculopathy: Secondary | ICD-10-CM | POA: Diagnosis not present

## 2020-01-15 DIAGNOSIS — M5412 Radiculopathy, cervical region: Secondary | ICD-10-CM | POA: Diagnosis not present

## 2020-03-02 DIAGNOSIS — M5020 Other cervical disc displacement, unspecified cervical region: Secondary | ICD-10-CM | POA: Diagnosis not present

## 2020-03-09 DIAGNOSIS — M25561 Pain in right knee: Secondary | ICD-10-CM | POA: Diagnosis not present

## 2020-05-20 DIAGNOSIS — E785 Hyperlipidemia, unspecified: Secondary | ICD-10-CM | POA: Diagnosis not present

## 2020-05-20 DIAGNOSIS — R030 Elevated blood-pressure reading, without diagnosis of hypertension: Secondary | ICD-10-CM | POA: Diagnosis not present

## 2020-06-22 ENCOUNTER — Ambulatory Visit
Admission: RE | Admit: 2020-06-22 | Discharge: 2020-06-22 | Disposition: A | Discharge status: Home / Self Care | Payer: BLUE CROSS/BLUE SHIELD | Source: Ambulatory Visit | Attending: Internal Medicine | Admitting: Internal Medicine

## 2020-06-22 ENCOUNTER — Other Ambulatory Visit: Payer: Self-pay | Admitting: Internal Medicine

## 2020-06-22 DIAGNOSIS — E785 Hyperlipidemia, unspecified: Secondary | ICD-10-CM | POA: Diagnosis not present

## 2020-06-22 DIAGNOSIS — R0789 Other chest pain: Secondary | ICD-10-CM

## 2020-06-22 DIAGNOSIS — R0781 Pleurodynia: Secondary | ICD-10-CM | POA: Diagnosis not present

## 2020-10-26 DIAGNOSIS — Z20822 Contact with and (suspected) exposure to covid-19: Secondary | ICD-10-CM | POA: Diagnosis not present

## 2020-11-06 DIAGNOSIS — E785 Hyperlipidemia, unspecified: Secondary | ICD-10-CM | POA: Diagnosis not present

## 2020-11-06 DIAGNOSIS — J301 Allergic rhinitis due to pollen: Secondary | ICD-10-CM | POA: Diagnosis not present

## 2020-11-06 DIAGNOSIS — Z125 Encounter for screening for malignant neoplasm of prostate: Secondary | ICD-10-CM | POA: Diagnosis not present

## 2020-11-06 DIAGNOSIS — Z0000 Encounter for general adult medical examination without abnormal findings: Secondary | ICD-10-CM | POA: Diagnosis not present

## 2020-11-11 DIAGNOSIS — Z125 Encounter for screening for malignant neoplasm of prostate: Secondary | ICD-10-CM | POA: Diagnosis not present

## 2020-11-11 DIAGNOSIS — Z0000 Encounter for general adult medical examination without abnormal findings: Secondary | ICD-10-CM | POA: Diagnosis not present

## 2020-11-11 DIAGNOSIS — E785 Hyperlipidemia, unspecified: Secondary | ICD-10-CM | POA: Diagnosis not present

## 2020-11-11 DIAGNOSIS — Z1211 Encounter for screening for malignant neoplasm of colon: Secondary | ICD-10-CM | POA: Diagnosis not present

## 2020-11-24 DIAGNOSIS — Z23 Encounter for immunization: Secondary | ICD-10-CM | POA: Diagnosis not present

## 2020-11-25 DIAGNOSIS — M65342 Trigger finger, left ring finger: Secondary | ICD-10-CM | POA: Diagnosis not present

## 2021-02-01 DIAGNOSIS — M65342 Trigger finger, left ring finger: Secondary | ICD-10-CM | POA: Diagnosis not present

## 2021-09-14 DIAGNOSIS — R1030 Lower abdominal pain, unspecified: Secondary | ICD-10-CM | POA: Diagnosis not present

## 2021-09-14 DIAGNOSIS — R351 Nocturia: Secondary | ICD-10-CM | POA: Diagnosis not present

## 2021-09-28 IMAGING — DX DG RIBS W/ CHEST 3+V*L*
3 series · 3 of 3 positions shown · non-contrast
Comparison: 05/19/2011 chest radiograph.

CLINICAL DATA: Tenderness

EXAM:
LEFT RIBS AND CHEST - 3+ VIEW

[dg ribs unilateral w/chest left (1 of 3)]
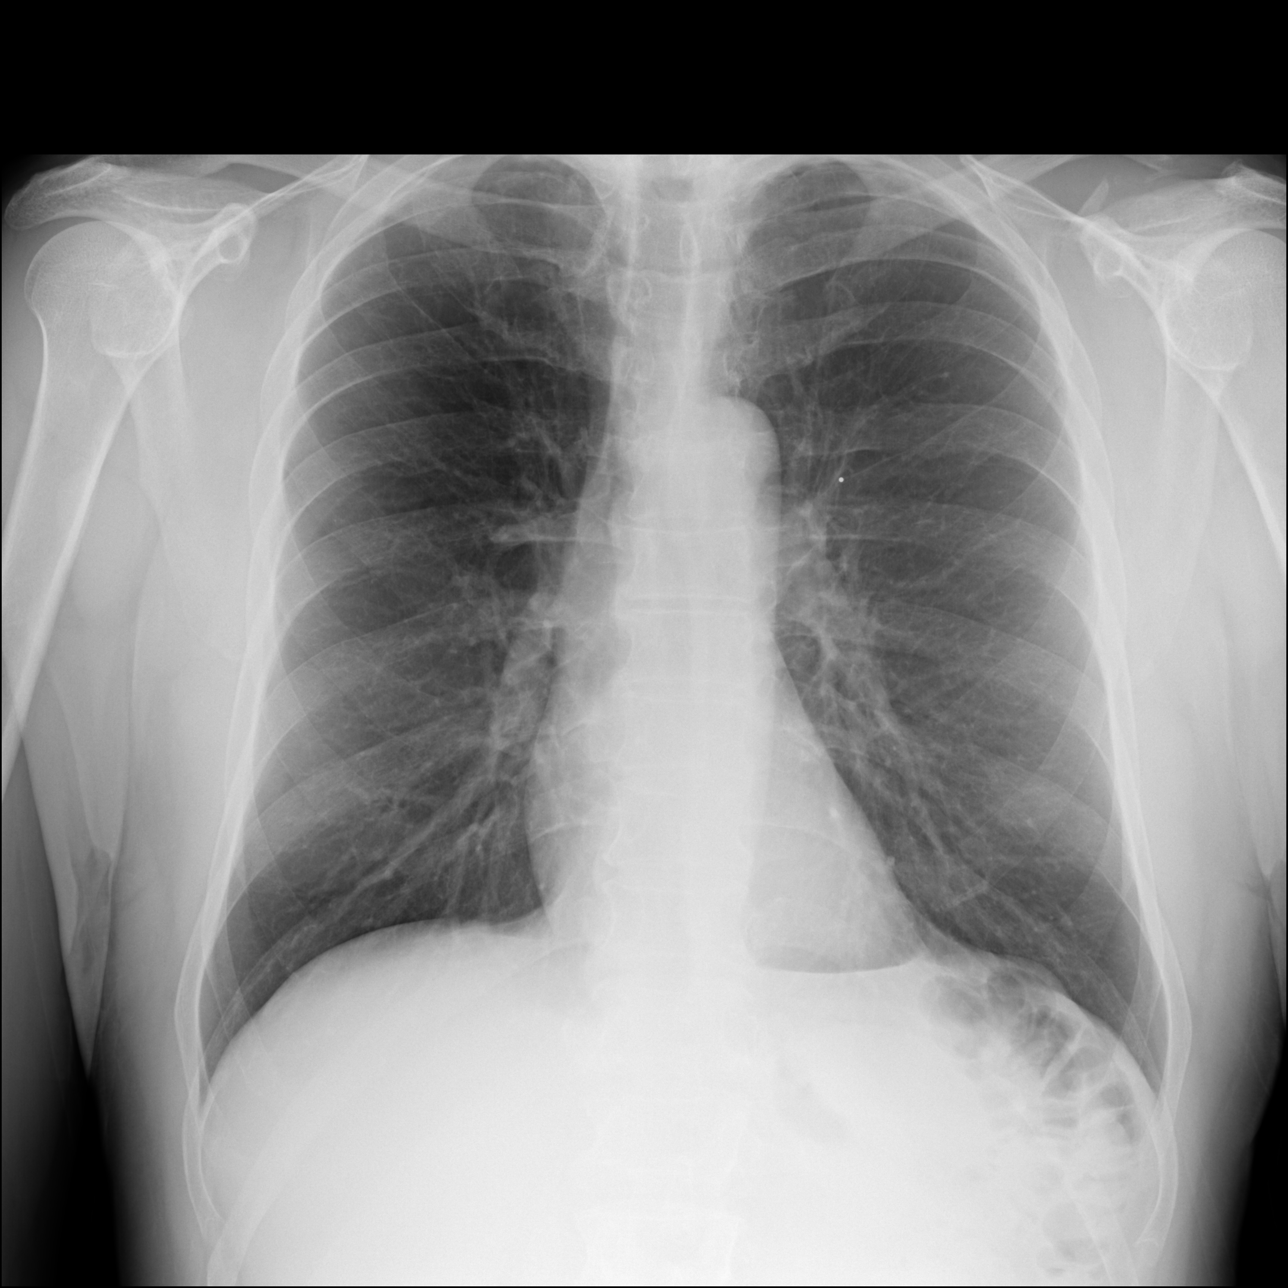

[dg ribs unilateral w/chest left (2 of 3)]
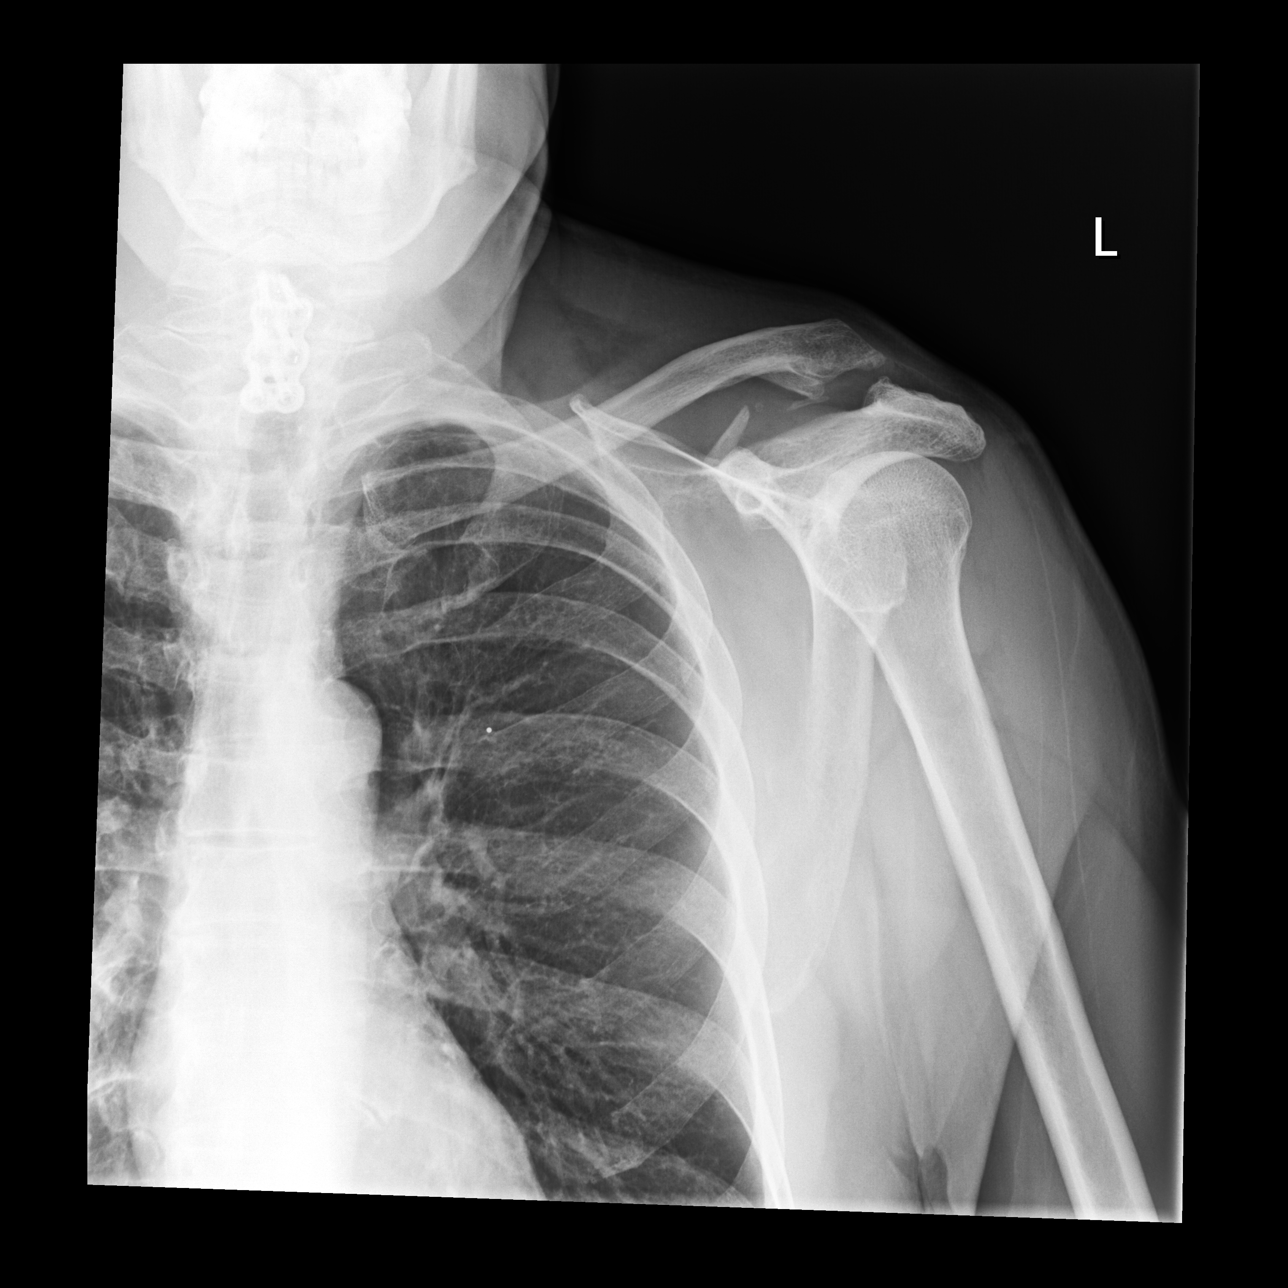

[dg ribs unilateral w/chest left (3 of 3)]
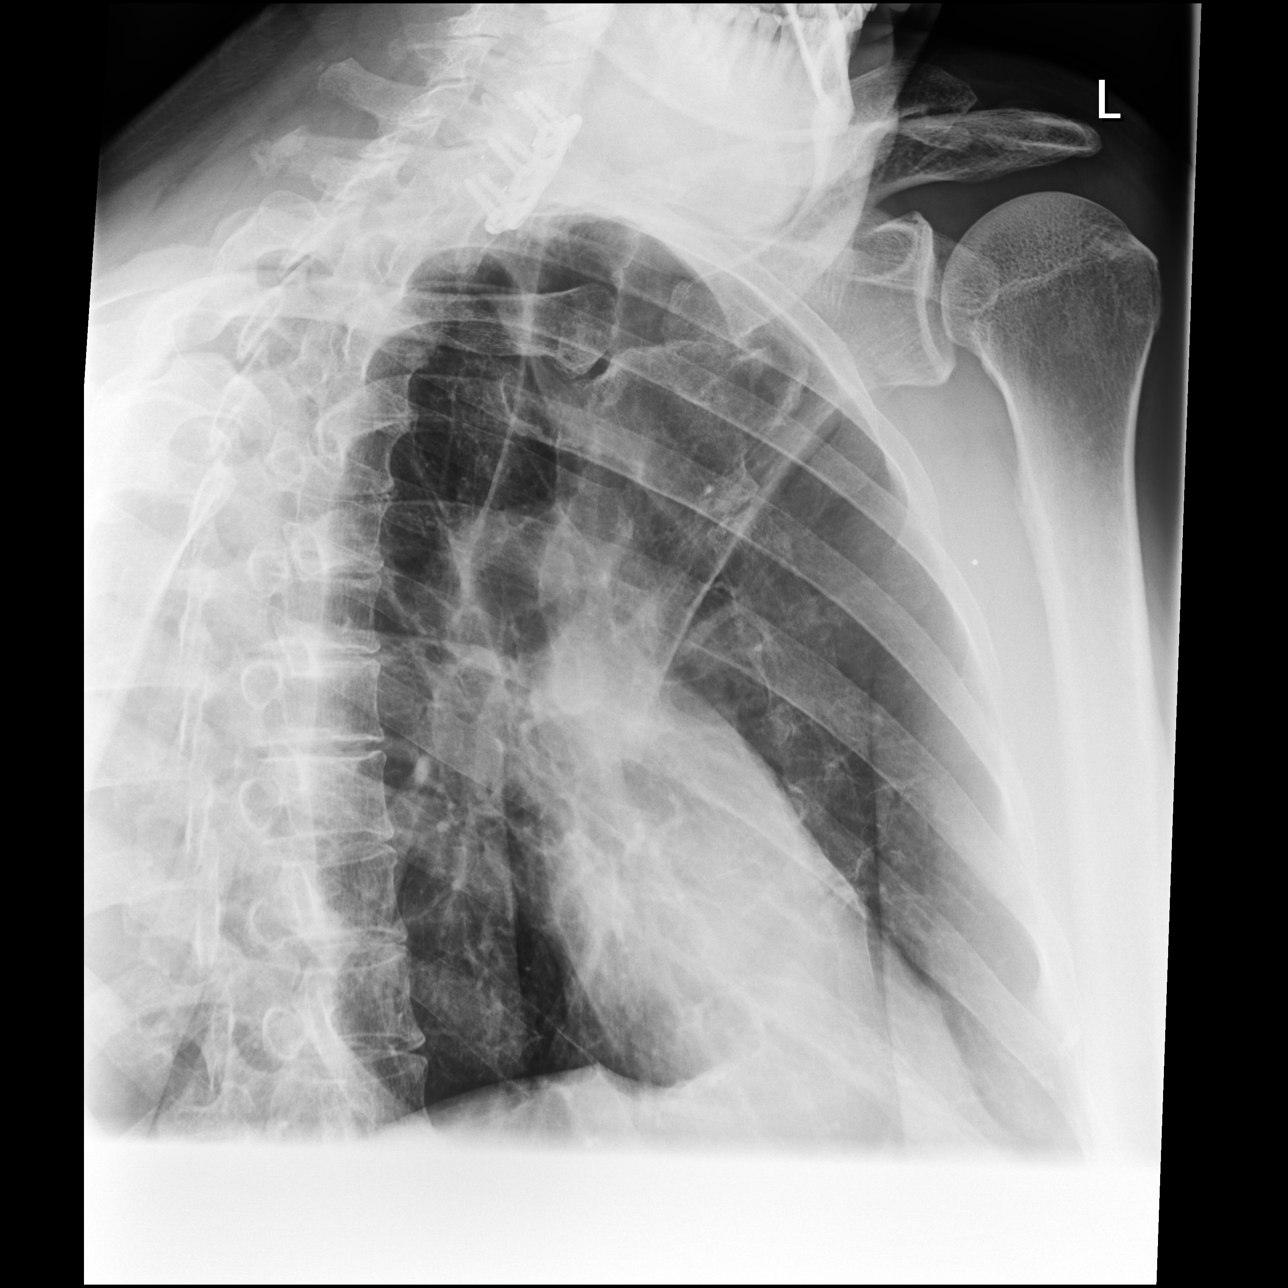

[3 of 3 positions shown; findings below may reference images not displayed]

FINDINGS: No fracture or other bone lesions are seen involving the ribs. There
is no evidence of pneumothorax or pleural effusion. Both lungs are
clear. Heart size and mediastinal contours are within normal limits.
Sequela of ACDF.
IMPRESSION: Negative.

## 2021-11-02 DIAGNOSIS — R10814 Left lower quadrant abdominal tenderness: Secondary | ICD-10-CM | POA: Diagnosis not present

## 2021-11-02 DIAGNOSIS — Z23 Encounter for immunization: Secondary | ICD-10-CM | POA: Diagnosis not present

## 2021-11-02 DIAGNOSIS — R1032 Left lower quadrant pain: Secondary | ICD-10-CM | POA: Diagnosis not present

## 2021-11-22 DIAGNOSIS — N400 Enlarged prostate without lower urinary tract symptoms: Secondary | ICD-10-CM | POA: Diagnosis not present

## 2021-11-22 DIAGNOSIS — Z23 Encounter for immunization: Secondary | ICD-10-CM | POA: Diagnosis not present

## 2021-11-22 DIAGNOSIS — E785 Hyperlipidemia, unspecified: Secondary | ICD-10-CM | POA: Diagnosis not present

## 2021-11-22 DIAGNOSIS — Z1389 Encounter for screening for other disorder: Secondary | ICD-10-CM | POA: Diagnosis not present

## 2021-11-22 DIAGNOSIS — Z125 Encounter for screening for malignant neoplasm of prostate: Secondary | ICD-10-CM | POA: Diagnosis not present

## 2021-11-22 DIAGNOSIS — Z0000 Encounter for general adult medical examination without abnormal findings: Secondary | ICD-10-CM | POA: Diagnosis not present

## 2021-11-22 DIAGNOSIS — J301 Allergic rhinitis due to pollen: Secondary | ICD-10-CM | POA: Diagnosis not present

## 2022-03-16 DIAGNOSIS — E785 Hyperlipidemia, unspecified: Secondary | ICD-10-CM | POA: Diagnosis not present

## 2022-03-16 DIAGNOSIS — R1032 Left lower quadrant pain: Secondary | ICD-10-CM | POA: Diagnosis not present

## 2022-05-26 DIAGNOSIS — E785 Hyperlipidemia, unspecified: Secondary | ICD-10-CM | POA: Diagnosis not present

## 2022-05-26 DIAGNOSIS — H9190 Unspecified hearing loss, unspecified ear: Secondary | ICD-10-CM | POA: Diagnosis not present

## 2022-08-04 DIAGNOSIS — M542 Cervicalgia: Secondary | ICD-10-CM | POA: Diagnosis not present

## 2022-08-22 DIAGNOSIS — R41840 Attention and concentration deficit: Secondary | ICD-10-CM | POA: Diagnosis not present

## 2022-09-06 DIAGNOSIS — L723 Sebaceous cyst: Secondary | ICD-10-CM | POA: Diagnosis not present

## 2022-09-19 DIAGNOSIS — F909 Attention-deficit hyperactivity disorder, unspecified type: Secondary | ICD-10-CM | POA: Diagnosis not present

## 2022-09-19 DIAGNOSIS — K4090 Unilateral inguinal hernia, without obstruction or gangrene, not specified as recurrent: Secondary | ICD-10-CM | POA: Diagnosis not present

## 2022-09-19 DIAGNOSIS — M779 Enthesopathy, unspecified: Secondary | ICD-10-CM | POA: Diagnosis not present

## 2022-10-27 DIAGNOSIS — K4020 Bilateral inguinal hernia, without obstruction or gangrene, not specified as recurrent: Secondary | ICD-10-CM | POA: Diagnosis not present

## 2022-11-18 DIAGNOSIS — R040 Epistaxis: Secondary | ICD-10-CM | POA: Diagnosis not present

## 2022-12-14 DIAGNOSIS — E785 Hyperlipidemia, unspecified: Secondary | ICD-10-CM | POA: Diagnosis not present

## 2022-12-14 DIAGNOSIS — Z1331 Encounter for screening for depression: Secondary | ICD-10-CM | POA: Diagnosis not present

## 2022-12-14 DIAGNOSIS — K4090 Unilateral inguinal hernia, without obstruction or gangrene, not specified as recurrent: Secondary | ICD-10-CM | POA: Diagnosis not present

## 2022-12-14 DIAGNOSIS — Z0000 Encounter for general adult medical examination without abnormal findings: Secondary | ICD-10-CM | POA: Diagnosis not present

## 2022-12-14 DIAGNOSIS — F909 Attention-deficit hyperactivity disorder, unspecified type: Secondary | ICD-10-CM | POA: Diagnosis not present

## 2022-12-14 DIAGNOSIS — Z125 Encounter for screening for malignant neoplasm of prostate: Secondary | ICD-10-CM | POA: Diagnosis not present

## 2022-12-15 DIAGNOSIS — R040 Epistaxis: Secondary | ICD-10-CM | POA: Diagnosis not present

## 2023-02-02 DIAGNOSIS — K4020 Bilateral inguinal hernia, without obstruction or gangrene, not specified as recurrent: Secondary | ICD-10-CM | POA: Diagnosis not present

## 2023-03-07 DIAGNOSIS — K080 Exfoliation of teeth due to systemic causes: Secondary | ICD-10-CM | POA: Diagnosis not present

## 2023-04-17 DIAGNOSIS — J301 Allergic rhinitis due to pollen: Secondary | ICD-10-CM | POA: Diagnosis not present

## 2023-05-01 DIAGNOSIS — J029 Acute pharyngitis, unspecified: Secondary | ICD-10-CM | POA: Diagnosis not present

## 2023-05-01 DIAGNOSIS — J301 Allergic rhinitis due to pollen: Secondary | ICD-10-CM | POA: Diagnosis not present

## 2023-05-01 DIAGNOSIS — Z03818 Encounter for observation for suspected exposure to other biological agents ruled out: Secondary | ICD-10-CM | POA: Diagnosis not present

## 2023-05-16 DIAGNOSIS — R490 Dysphonia: Secondary | ICD-10-CM | POA: Diagnosis not present

## 2023-05-16 DIAGNOSIS — J383 Other diseases of vocal cords: Secondary | ICD-10-CM | POA: Diagnosis not present

## 2023-05-16 HISTORY — DX: Other diseases of vocal cords: J38.3

## 2023-05-19 NOTE — Telephone Encounter (Signed)
 Scheduled surgery at [facility] on 06/05/23

## 2023-05-26 ENCOUNTER — Encounter (HOSPITAL_BASED_OUTPATIENT_CLINIC_OR_DEPARTMENT_OTHER): Payer: Self-pay | Admitting: Otolaryngology

## 2023-05-29 NOTE — H&P (Signed)
HPI:   Cameron Campbell is a 67 y.o. male who presents as a consult Patient.   Referring Provider: Lorenda Ishihara,*  Chief complaint: Hoarseness.  HPI: He has been hoarse since the end of May. It has been consistent. He has no other symptoms. He did not have any upper respiratory infection at that time. He had neck surgery about 3 years ago but did not have problems after that. He does not smoke. He does drink coffee daily. He does not have heartburn. He does not use his voice excessively.  PMH/Meds/All/SocHx/FamHx/ROS:   History reviewed. No pertinent past medical history.  History reviewed. No pertinent surgical history.  No family history of bleeding disorders, wound healing problems or difficulty with anesthesia.     Current Outpatient Medications:  atorvastatin (LIPITOR) 10 mg tablet, take one tablet at bedtime. by mouth once a day for 90 days, Disp: , Rfl:  dextroamphetamine-amphetamine (ADDERALL) 10 mg tablet, Take 1 tablet by mouth., Disp: , Rfl:   A complete ROS was performed with pertinent positives/negatives noted in the HPI. The remainder of the ROS are negative.   Physical Exam:   Temp 97.6 F (36.4 C) (Temporal)  Resp 18  Ht 1.803 m (5\' 11" )  Wt 77 kg (169 lb 12.8 oz)  BMI 23.68 kg/m   General: Healthy and alert, in no distress, breathing easily. Normal affect. In a pleasant mood. Head: Normocephalic, atraumatic. No masses, or scars. Eyes: Pupils are equal, and reactive to light. Vision is grossly intact. No spontaneous or gaze nystagmus. Ears: Ear canals are clear. Tympanic membranes are intact, with normal landmarks and the middle ears are clear and healthy. Hearing: Grossly normal. Nose: Nasal cavities are clear with healthy mucosa, no polyps or exudate. Airways are patent. Face: No masses or scars, facial nerve function is symmetric. Oral Cavity: No mucosal abnormalities are noted. Tongue with normal mobility. Dentition appears healthy. Oropharynx:  Tonsils are symmetric. There are no mucosal masses identified. Tongue base appears normal and healthy. Larynx/Hypopharynx: Cords move well. There is a white papillary lesion, very small, along the contacting surface of the right mid vocal cord. No other lesions identified. Chest: Deferred Neck: No palpable masses, no cervical adenopathy, no thyroid nodules or enlargement. Neuro: Cranial nerves II-XII with normal function. Balance: Normal gate. Other findings: none.  Independent Review of Additional Tests or Records:  none  Procedures:  none  Impression & Plans:  Right cord lesion. Low risk of carcinoma but needs to be investigated. Recommend microlaryngoscopy with excision. Will schedule at his convenience.

## 2023-05-30 NOTE — Progress Notes (Signed)
????????????????????????    Patient:?  Cameron Campbell DOB:?  [birth year]  Sex:   male EMRN:?  [id number]  Encounter Date:  05/30/2023     ?  ?  Orders  ?  FACILITY:?? [facility] ?  SURGEON:?? [name], MD ?  SURGERY DATE:? 06/05/23 Mon  ???????????TIME: ??11:15 am????????????????????   ?  ANESTHESIA:?? General ?  STATUS:?? Outpatient ?  PROCEDURE:?? Microlaryngoscopy w/Excision of Vocal Cord Lesion - [id number] ?  DIAGNOSIS:? 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  ?  LATEX ALLERGY: ?  ?  EQUIPMENT: ????????????  ?  INSURANCE:?  BLUEMEDICARE # [id number]   SECONDARY:?????  ??  PRECERT#: ?

## 2023-06-05 ENCOUNTER — Ambulatory Visit (HOSPITAL_BASED_OUTPATIENT_CLINIC_OR_DEPARTMENT_OTHER): Payer: Medicare Other | Admitting: Anesthesiology

## 2023-06-05 ENCOUNTER — Encounter (HOSPITAL_BASED_OUTPATIENT_CLINIC_OR_DEPARTMENT_OTHER)
Admission: RE | Disposition: A | Discharge status: Home / Self Care | Payer: Self-pay | Source: Home / Self Care | Attending: Otolaryngology

## 2023-06-05 ENCOUNTER — Ambulatory Visit (HOSPITAL_BASED_OUTPATIENT_CLINIC_OR_DEPARTMENT_OTHER)
Admission: RE | Admit: 2023-06-05 | Discharge: 2023-06-05 | Disposition: A | Discharge status: Home / Self Care | Payer: Medicare Other | Attending: Otolaryngology | Admitting: Otolaryngology

## 2023-06-05 ENCOUNTER — Encounter (HOSPITAL_BASED_OUTPATIENT_CLINIC_OR_DEPARTMENT_OTHER): Payer: Self-pay | Admitting: Otolaryngology

## 2023-06-05 DIAGNOSIS — Z01818 Encounter for other preprocedural examination: Secondary | ICD-10-CM

## 2023-06-05 DIAGNOSIS — J383 Other diseases of vocal cords: Secondary | ICD-10-CM | POA: Insufficient documentation

## 2023-06-05 DIAGNOSIS — J387 Other diseases of larynx: Secondary | ICD-10-CM | POA: Diagnosis not present

## 2023-06-05 HISTORY — PX: MICROLARYNGOSCOPY: SHX5208

## 2023-06-05 SURGERY — MICROLARYNGOSCOPY
Anesthesia: General | Site: Throat | Laterality: Right

## 2023-06-05 MED ORDER — ONDANSETRON HCL 4 MG/2ML IJ SOLN
INTRAMUSCULAR | Status: DC | PRN
  Administered 2023-06-05: 4 mg via INTRAVENOUS

## 2023-06-05 MED ORDER — MIDAZOLAM HCL 5 MG/5ML IJ SOLN
INTRAMUSCULAR | Status: DC | PRN
  Administered 2023-06-05: 2 mg via INTRAVENOUS

## 2023-06-05 MED ORDER — ROCURONIUM BROMIDE 100 MG/10ML IV SOLN
INTRAVENOUS | Status: DC | PRN
  Administered 2023-06-05: 20 mg via INTRAVENOUS
  Administered 2023-06-05: 10 mg via INTRAVENOUS

## 2023-06-05 MED ORDER — FENTANYL CITRATE (PF) 100 MCG/2ML IJ SOLN
INTRAMUSCULAR | Status: DC | PRN
  Administered 2023-06-05 (×2): 50 ug via INTRAVENOUS

## 2023-06-05 MED ORDER — DEXAMETHASONE SODIUM PHOSPHATE 4 MG/ML IJ SOLN
INTRAMUSCULAR | Status: DC | PRN
  Administered 2023-06-05: 10 mg via INTRAVENOUS

## 2023-06-05 MED ORDER — OXYCODONE HCL 5 MG PO TABS: 5.0000 mg | ORAL_TABLET | Freq: Once | ORAL | Status: DC | PRN

## 2023-06-05 MED ORDER — LIDOCAINE-EPINEPHRINE 1 %-1:100000 IJ SOLN
INTRAMUSCULAR | Status: DC | PRN
  Administered 2023-06-05: .6 mL

## 2023-06-05 MED ORDER — HYDROMORPHONE HCL 1 MG/ML IJ SOLN: 0.2500 mg | INTRAMUSCULAR | Status: DC | PRN

## 2023-06-05 MED ORDER — PROPOFOL 10 MG/ML IV BOLUS
INTRAVENOUS | Status: DC | PRN
Start: 2023-06-05 — End: 2023-06-05
  Administered 2023-06-05: 140 mg via INTRAVENOUS

## 2023-06-05 MED ORDER — OXYCODONE HCL 5 MG/5ML PO SOLN: 5.0000 mg | Freq: Once | ORAL | Status: DC | PRN

## 2023-06-05 MED ORDER — PROPOFOL 500 MG/50ML IV EMUL
INTRAVENOUS | Status: DC | PRN
Start: 2023-06-05 — End: 2023-06-05
  Administered 2023-06-05: 75 ug/kg/min via INTRAVENOUS

## 2023-06-05 MED ORDER — LACTATED RINGERS IV SOLN: INTRAVENOUS | Status: DC

## 2023-06-05 MED ORDER — ONDANSETRON HCL 4 MG/2ML IJ SOLN: 4.0000 mg | Freq: Once | INTRAMUSCULAR | Status: DC | PRN

## 2023-06-05 MED ORDER — SUCCINYLCHOLINE CHLORIDE 200 MG/10ML IV SOSY
PREFILLED_SYRINGE | INTRAVENOUS | Status: DC | PRN
  Administered 2023-06-05: 100 mg via INTRAVENOUS

## 2023-06-05 MED ORDER — LIDOCAINE HCL (CARDIAC) PF 100 MG/5ML IV SOSY
PREFILLED_SYRINGE | INTRAVENOUS | Status: DC | PRN
  Administered 2023-06-05: 60 mg via INTRAVENOUS

## 2023-06-05 MED ORDER — SUGAMMADEX SODIUM 200 MG/2ML IV SOLN
INTRAVENOUS | Status: DC | PRN
Start: 2023-06-05 — End: 2023-06-05
  Administered 2023-06-05: 300 mg via INTRAVENOUS

## 2023-06-05 MED ORDER — EPINEPHRINE PF 1 MG/ML IJ SOLN
INTRAMUSCULAR | Status: DC | PRN
  Administered 2023-06-05: 1 mL

## 2023-06-05 SURGICAL SUPPLY — 29 items
CANISTER SUCT 1200ML W/VALVE (MISCELLANEOUS) ×3 IMPLANT
DEFOGGER MIRROR 1QT (MISCELLANEOUS) ×2 IMPLANT
GAUZE SPONGE 4X4 12PLY STRL LF (GAUZE/BANDAGES/DRESSINGS) ×4 IMPLANT
GLOVE BIOGEL PI IND STRL 7.5 (GLOVE) ×1 IMPLANT
GLOVE ECLIPSE 7.5 STRL STRAW (GLOVE) ×3 IMPLANT
GLOVE SURG SYN 7.5 E (GLOVE) ×2
GLOVE SURG SYN 7.5 PF PI (GLOVE) IMPLANT
GOWN STRL REUS W/ TWL LRG LVL3 (GOWN DISPOSABLE) ×1 IMPLANT
GOWN STRL REUS W/ TWL XL LVL3 (GOWN DISPOSABLE) ×1 IMPLANT
GOWN STRL REUS W/TWL LRG LVL3 (GOWN DISPOSABLE) ×2
GOWN STRL REUS W/TWL XL LVL3 (GOWN DISPOSABLE) ×2
GUARD TEETH (MISCELLANEOUS) ×1 IMPLANT
MARKER SKIN DUAL TIP RULER LAB (MISCELLANEOUS) IMPLANT
NDL HYPO 18GX1.5 BLUNT FILL (NEEDLE) ×1 IMPLANT
NDL SPNL 22GX7 QUINCKE BK (NEEDLE) IMPLANT
NEEDLE HYPO 18GX1.5 BLUNT FILL (NEEDLE) ×2
NEEDLE SPNL 22GX7 QUINCKE BK (NEEDLE) ×2
NS IRRIG 1000ML POUR BTL (IV SOLUTION) ×3 IMPLANT
PACK BASIN DAY SURGERY FS (CUSTOM PROCEDURE TRAY) ×3 IMPLANT
PATTIES SURGICAL .5 X3 (DISPOSABLE) ×2 IMPLANT
SHEET MEDIUM DRAPE 40X70 STRL (DRAPES) ×2 IMPLANT
SLEEVE SCD COMPRESS KNEE MED (STOCKING) ×1 IMPLANT
SURGILUBE 2OZ TUBE FLIPTOP (MISCELLANEOUS) IMPLANT
SYR 5ML LL (SYRINGE) ×3 IMPLANT
SYR CONTROL 10ML LL (SYRINGE) IMPLANT
SYR TB 1ML LL NO SAFETY (SYRINGE) ×1 IMPLANT
TOWEL GREEN STERILE FF (TOWEL DISPOSABLE) ×2 IMPLANT
TUBE CONNECTING 20X1/4 (TUBING) ×5 IMPLANT
YANKAUER SUCT BULB TIP NO VENT (SUCTIONS) ×1 IMPLANT

## 2023-06-05 NOTE — Anesthesia Postprocedure Evaluation (Signed)
Anesthesia Post Note  Patient: Cameron Campbell  Procedure(s) Performed: Microlaryngoscopy with Excision of Vocal Cord Lesion (Right: Throat)     Patient location during evaluation: PACU Anesthesia Type: General Level of consciousness: awake and alert and oriented Pain management: pain level controlled Vital Signs Assessment: post-procedure vital signs reviewed and stable Respiratory status: spontaneous breathing, nonlabored ventilation and respiratory function stable Cardiovascular status: blood pressure returned to baseline and stable Postop Assessment: no apparent nausea or vomiting Anesthetic complications: no   No notable events documented.  Last Vitals:  Vitals:   06/05/23 1209 06/05/23 1227  BP:  121/70  Pulse: 68 66  Resp: 14 16  Temp:  (!) 36.2 C  SpO2: 96% 95%    Last Pain:  Vitals:   06/05/23 1227  TempSrc:   PainSc: 0-No pain                 Afton Lavalle A.

## 2023-06-05 NOTE — Discharge Instructions (Addendum)
Avoid screaming, whispering, singing or straining the voice for the next 2 weeks.  That is okay to talk in an easy relaxed and comfortable voice.  No dietary restrictions.   Post Anesthesia Home Care Instructions  Activity: Get plenty of rest for the remainder of the day. A responsible individual must stay with you for 24 hours following the procedure.  For the next 24 hours, DO NOT: -Drive a car -Advertising copywriter -Drink alcoholic beverages -Take any medication unless instructed by your physician -Make any legal decisions or sign important papers.  Meals: Start with liquid foods such as gelatin or soup. Progress to regular foods as tolerated. Avoid greasy, spicy, heavy foods. If nausea and/or vomiting occur, drink only clear liquids until the nausea and/or vomiting subsides. Call your physician if vomiting continues.  Special Instructions/Symptoms: Your throat may feel dry or sore from the anesthesia or the breathing tube placed in your throat during surgery. If this causes discomfort, gargle with warm salt water. The discomfort should disappear within 24 hours.  If you had a scopolamine patch placed behind your ear for the management of post- operative nausea and/or vomiting:  1. The medication in the patch is effective for 72 hours, after which it should be removed.  Wrap patch in a tissue and discard in the trash. Wash hands thoroughly with soap and water. 2. You may remove the patch earlier than 72 hours if you experience unpleasant side effects which may include dry mouth, dizziness or visual disturbances. 3. Avoid touching the patch. Wash your hands with soap and water after contact with the patch.

## 2023-06-05 NOTE — Anesthesia Preprocedure Evaluation (Addendum)
Anesthesia Evaluation  Patient identified by MRN, date of birth, ID band Patient awake    Reviewed: Allergy & Precautions, NPO status , Patient's Chart, lab work & pertinent test results  Airway Mallampati: II       Dental no notable dental hx. (+) Teeth Intact, Dental Advisory Given, Caps   Pulmonary  Lesion of true VC   Pulmonary exam normal breath sounds clear to auscultation       Cardiovascular negative cardio ROS Normal cardiovascular exam Rhythm:Regular Rate:Normal     Neuro/Psych Lumbar Radiculopathy  Neuromuscular disease  negative psych ROS   GI/Hepatic negative GI ROS, Neg liver ROS,,,  Endo/Other  negative endocrine ROS    Renal/GU negative Renal ROS  negative genitourinary   Musculoskeletal negative musculoskeletal ROS (+)    Abdominal   Peds  Hematology negative hematology ROS (+)   Anesthesia Other Findings   Reproductive/Obstetrics                             Anesthesia Physical Anesthesia Plan  ASA: 2  Anesthesia Plan: General   Post-op Pain Management: Minimal or no pain anticipated   Induction: Intravenous  PONV Risk Score and Plan: 4 or greater and Treatment may vary due to age or medical condition, Ondansetron and Dexamethasone  Airway Management Planned: Oral ETT  Additional Equipment: None  Intra-op Plan:   Post-operative Plan: Extubation in OR  Informed Consent: I have reviewed the patients History and Physical, chart, labs and discussed the procedure including the risks, benefits and alternatives for the proposed anesthesia with the patient or authorized representative who has indicated his/her understanding and acceptance.     Dental advisory given  Plan Discussed with: CRNA and Anesthesiologist  Anesthesia Plan Comments:        Anesthesia Quick Evaluation

## 2023-06-05 NOTE — Interval H&P Note (Signed)
History and Physical Interval Note:  06/05/2023 10:32 AM  Cameron Campbell  has presented today for surgery, with the diagnosis of Lesion of true vocal cord.  The various methods of treatment have been discussed with the patient and family. After consideration of risks, benefits and other options for treatment, the patient has consented to  Procedure(s): MICROLARYNGOSCOPY WITH CO2 LASER AND EXCISION OF VOCAL CORD LESION (N/A) as a surgical intervention.  The patient's history has been reviewed, patient examined, no change in status, stable for surgery.  I have reviewed the patient's chart and labs.  Questions were answered to the patient's satisfaction.     Cameron Campbell

## 2023-06-05 NOTE — Anesthesia Procedure Notes (Signed)
Procedure Name: Intubation Date/Time: 06/05/2023 11:20 AM  Performed by: Burna Cash, CRNAPre-anesthesia Checklist: Patient identified, Emergency Drugs available, Suction available and Patient being monitored Patient Re-evaluated:Patient Re-evaluated prior to induction Oxygen Delivery Method: Circle system utilized Preoxygenation: Pre-oxygenation with 100% oxygen Induction Type: IV induction Ventilation: Mask ventilation without difficulty Laryngoscope Size: Mac and 4 Grade View: Grade I Tube type: Oral Tube size: 7.0 mm Number of attempts: 1 Airway Equipment and Method: Stylet and Oral airway Placement Confirmation: ETT inserted through vocal cords under direct vision, positive ETCO2 and breath sounds checked- equal and bilateral Secured at: 21 cm Tube secured with: Tape Dental Injury: Teeth and Oropharynx as per pre-operative assessment

## 2023-06-05 NOTE — Op Note (Signed)
OPERATIVE REPORT  DATE OF SURGERY: 06/05/2023  PATIENT:  Cameron Campbell,  67 y.o. male  PRE-OPERATIVE DIAGNOSIS:  Lesion of true vocal cord, right  POST-OPERATIVE DIAGNOSIS: Same  PROCEDURE:  Procedure(s): Microlaryngoscopy with Excision of Vocal Cord Lesion, right  SURGEON:  Susy Frizzle, MD  ASSISTANTS: none  ANESTHESIA:   General   EBL: 5 ml  DRAINS: none  LOCAL MEDICATIONS USED: 1% Xylocaine with epinephrine  SPECIMEN: Right vocal cord lesion  COUNTS:  Correct  PROCEDURE DETAILS: The patient was taken to the operating room and placed on the operating table in the supine position. Following induction of general endotracheal anesthesia, the table was turned 90 and the patient was draped in a standard fashion.  A maxillary tooth protector was used throughout the case.  A Jako laryngoscope was used to evaluate the larynx and the tested mostly with the suspension apparatus.  The operating microscope was used as well.  Lesion was identified in the right mid vocal cord.  The right cord was infiltrated with local anesthetic solution using a spinal needle.  Microlaryngoscopy forceps and scissors were used to remove the entire lesion.  It did not invade into the vocal ligament.  Lesion was removed in its entirety and sent for pathologic evaluation.  Topical adrenaline was used for hemostasis.  Patient was then awakened extubated and transferred to recovery in stable condition.    PATIENT DISPOSITION:  To PACU, stable

## 2023-06-05 NOTE — Transfer of Care (Signed)
Immediate Anesthesia Transfer of Care Note  Patient: BENJIMIN STROUGH  Procedure(s) Performed: Microlaryngoscopy with Excision of Vocal Cord Lesion (Right: Throat)  Patient Location: PACU  Anesthesia Type:General  Level of Consciousness: awake, alert , and oriented  Airway & Oxygen Therapy: Patient Spontanous Breathing and Patient connected to face mask oxygen  Post-op Assessment: Report given to RN and Post -op Vital signs reviewed and stable  Post vital signs: Reviewed and stable  Last Vitals:  Vitals Value Taken Time  BP 101/50 06/05/23 1154  Temp 36.2 C 06/05/23 1154  Pulse 74 06/05/23 1157  Resp 12 06/05/23 1157  SpO2 98 % 06/05/23 1157  Vitals shown include unfiled device data.  Last Pain:  Vitals:   06/05/23 1002  TempSrc: Temporal  PainSc: 0-No pain      Patients Stated Pain Goal: 3 (06/05/23 1002)  Complications: No notable events documented.

## 2023-06-06 ENCOUNTER — Encounter (HOSPITAL_BASED_OUTPATIENT_CLINIC_OR_DEPARTMENT_OTHER): Payer: Self-pay | Admitting: Otolaryngology

## 2023-06-27 DIAGNOSIS — E785 Hyperlipidemia, unspecified: Secondary | ICD-10-CM | POA: Diagnosis not present

## 2023-07-13 DIAGNOSIS — J383 Other diseases of vocal cords: Secondary | ICD-10-CM | POA: Diagnosis not present

## 2023-08-29 DIAGNOSIS — J383 Other diseases of vocal cords: Secondary | ICD-10-CM | POA: Diagnosis not present

## 2023-09-26 DIAGNOSIS — K080 Exfoliation of teeth due to systemic causes: Secondary | ICD-10-CM | POA: Diagnosis not present

## 2023-11-06 DIAGNOSIS — Z23 Encounter for immunization: Secondary | ICD-10-CM | POA: Diagnosis not present

## 2023-12-20 DIAGNOSIS — Z125 Encounter for screening for malignant neoplasm of prostate: Secondary | ICD-10-CM | POA: Diagnosis not present

## 2023-12-20 DIAGNOSIS — Z136 Encounter for screening for cardiovascular disorders: Secondary | ICD-10-CM | POA: Diagnosis not present

## 2023-12-20 DIAGNOSIS — Z1331 Encounter for screening for depression: Secondary | ICD-10-CM | POA: Diagnosis not present

## 2023-12-20 DIAGNOSIS — E785 Hyperlipidemia, unspecified: Secondary | ICD-10-CM | POA: Diagnosis not present

## 2023-12-20 DIAGNOSIS — Z0000 Encounter for general adult medical examination without abnormal findings: Secondary | ICD-10-CM | POA: Diagnosis not present

## 2023-12-20 DIAGNOSIS — Z8249 Family history of ischemic heart disease and other diseases of the circulatory system: Secondary | ICD-10-CM | POA: Diagnosis not present

## 2024-01-02 DIAGNOSIS — M4712 Other spondylosis with myelopathy, cervical region: Secondary | ICD-10-CM | POA: Diagnosis not present

## 2024-01-02 DIAGNOSIS — E785 Hyperlipidemia, unspecified: Secondary | ICD-10-CM | POA: Diagnosis not present

## 2024-01-02 DIAGNOSIS — K529 Noninfective gastroenteritis and colitis, unspecified: Secondary | ICD-10-CM | POA: Diagnosis not present

## 2024-02-13 DIAGNOSIS — M25512 Pain in left shoulder: Secondary | ICD-10-CM | POA: Diagnosis not present

## 2024-02-27 DIAGNOSIS — M7542 Impingement syndrome of left shoulder: Secondary | ICD-10-CM | POA: Diagnosis not present

## 2024-03-06 DIAGNOSIS — M7542 Impingement syndrome of left shoulder: Secondary | ICD-10-CM | POA: Diagnosis not present

## 2024-03-12 DIAGNOSIS — M7542 Impingement syndrome of left shoulder: Secondary | ICD-10-CM | POA: Diagnosis not present

## 2024-04-03 ENCOUNTER — Ambulatory Visit (INDEPENDENT_AMBULATORY_CARE_PROVIDER_SITE_OTHER): Admitting: Otolaryngology

## 2024-04-03 ENCOUNTER — Encounter (INDEPENDENT_AMBULATORY_CARE_PROVIDER_SITE_OTHER): Payer: Self-pay | Admitting: Otolaryngology

## 2024-04-03 VITALS — BP 112/64 | HR 85

## 2024-04-03 DIAGNOSIS — R040 Epistaxis: Secondary | ICD-10-CM | POA: Diagnosis not present

## 2024-04-04 DIAGNOSIS — K080 Exfoliation of teeth due to systemic causes: Secondary | ICD-10-CM | POA: Diagnosis not present

## 2024-04-05 DIAGNOSIS — R040 Epistaxis: Secondary | ICD-10-CM | POA: Insufficient documentation

## 2024-04-05 NOTE — Progress Notes (Signed)
 Patient ID: ELIGIO ANGERT, male   DOB: [birth year], 68 y.o.   MRN: [id number]  CC: Recurrent left epistaxis  Procedure:  Endoscopic control of recurrent left epistaxis  Indication: The patient is a 68 year old male who presents today complaining of recurrent left epistaxis since the beginning of allergy season in the spring.  His last bleeding episode was yesterday.  He denies any recent facial or nasal trauma.  He is not on any blood thinner.  Description:  The left nasal cavity is sprayed with topical xylocaine  and neo-synephrine.  After adequate anesthesia is achieved, the nasal cavity is examined with a 0 rigid endoscope.  A suction catheter is inserted in parallel with the 0 endoscope, and it is used to suction blood clots from the left nasal cavity.  Hypervascular areas are noted at the anterior and superior aspect of the nasal septum.  A silver nitrate stick is inserted in parallel with the 0 endoscope.  It is used to repeatedly cauterized the hypervascular areas.  Good hemostasis is achieved.  The patient tolerated the procedure well.  Assessment: 1.  Hypervascular areas are noted on the left anterior and superior nasal septum.   2.  Active bleeding is noted today.    Plan: 1. Endoscopic cauterization of the left anterior and superior nasal septum. 2. The nasal endoscopy findings are reviewed with the patient. 3. Nasal ointment/humidifier to treat the nasal dryness. 4. The patient will return for re-evaluation in 1 month.

## 2024-04-16 DIAGNOSIS — J383 Other diseases of vocal cords: Secondary | ICD-10-CM | POA: Diagnosis not present

## 2024-05-13 DIAGNOSIS — J383 Other diseases of vocal cords: Secondary | ICD-10-CM | POA: Diagnosis not present

## 2024-05-14 ENCOUNTER — Other Ambulatory Visit: Payer: Self-pay

## 2024-05-14 ENCOUNTER — Encounter (HOSPITAL_COMMUNITY): Payer: Self-pay | Admitting: Otolaryngology

## 2024-05-14 NOTE — Progress Notes (Signed)
 PCP - Dr Valery Ripple   Cardiologist - none  Chest x-ray - n/a EKG - n/a Stress Test - 12/19/17 ECHO - n/a Cardiac Cath - n/a  ICD Pacemaker/Loop - n/a  Sleep Study -  n/a  Diabetes - n/a  Aspirin & Blood Thinner Instructions:  n/a  NPO  Anesthesia review: Yes  STOP now taking any Aspirin (unless otherwise instructed by your surgeon), Aleve, Naproxen, Ibuprofen, Motrin, Advil, Goody's, BC's, all herbal medications, fish oil, and all vitamins.   Coronavirus Screening Do you have any of the following symptoms:  Cough yes/no: No Fever (>100.47F)  yes/no: No Runny nose yes/no: No Sore throat yes/no: No Difficulty breathing/shortness of breath  yes/no: No  Have you traveled in the last 14 days and where? yes/no: No  Patient verbalized understanding of instructions that were given via phone.

## 2024-05-14 NOTE — Progress Notes (Signed)
 Anesthesia Chart Review: Cameron Campbell  Case: [id number] Date/Time: 05/15/24 1115   Procedure: MICROLARYNGOSCOPY, WITH PROCEDURE USING LASER   Anesthesia type: General   Diagnosis: Lesion of vocal cord [J38.3]   Pre-op diagnosis: Lesion of true vocal cord   [facility]: [facility] ROOM 08 / [facility]   Surgeons: Cameron Oliphant, MD       DISCUSSION: Patient is is a 68 year old male scheduled for the above procedure. Per 05/13/24 office visit with Dr. Jesus, No changes in his voice. On exam he continues to have the abnormal thickening of the white vocal cord. Mobility is preserved. No new masses identified. No adenopathy. Recommend proceed with microlaryngoscopy and excisional biopsy.  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  He was evaluated by cardiologist Cameron Campbell on 01/18/18 after he had an abnormal ETT. ETT had been ordered by PCP after he reported some jaw pain after a skiing trip in [place] , but thinks he had hit his face prior to the pain staring. His cholesterol was elevated. Since the he had no further jaw pain and denied chest pain, palpitations, SOB, syncope, edema. He was active then and was walking 2 miles at least 3x/week and walking 10K steps/day. Dr. Coni noted, Upsloping ST depression noted. THis may not be a sign of ischemia, as it was 9 minutes on the treadmill without sx. No downsloping ST depression noted. He has not had any further sx. Risk factor modification was felt to be a reasonable approach given his good exercise tolerance without recurrent symptoms. He was started on statin therapy. At 08/20/18 follow-up, as needed cardiology follow-up recommended.  He remains on Lipitor 10 mg nightly.  He has a same-day workup, so labs and EKG on arrival as indicated.  Anesthesia team  to evaluate on the day of surgery.   VS: Ht 5' 10 (1.778 m)   Wt 79.4 kg   BMI 25.11 kg/m  BP 104/55, HR 76 on 05/13/24 ([facility])  PROVIDERS: Cameron Charm, MD is PCP    LABS: For day of surgery as indicated. Last results noted in [facility] as of 12/23/23 included WBC 5.2, hemoglobin 14.3, platelet count 217, glucose 93, creatinine 0.96, AST 19, ALT 20.   EKG: EKG 08/20/18: NSR   CV: ETT 12/19/17: Blood pressure demonstrated a normal response to exercise. Upsloping ST segment depression ST segment depression of 2 mm was noted during stress in the II, III, aVF, V5 and V6 leads.   Positive ETT 2 mm of upsloping ST depression in inferior lateral leads Patient only achieved 89% of PMHR Normal BP response  - He had follow-up with cardiologist Cameron Beverage, MD, Upsloping ST depression noted. THis may not be a sign of ischemia, as it was 9 minutes on the treadmill without sx. No downsloping ST depression noted. He has not had any further sx.SABRASABRAHe will let us  know if there is any change in his symptoms. I did explain to him that if he had symptoms concerning for ischemia, we would either schedule a nuclear stress test or potentially an angiogram. He was started on statin therapy. As needed cardiology follow-up recommended at 08/20/18 follow-up.  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    atorvastatin (LIPITOR) 20 MG tablet   omeprazole (PRILOSEC) 20 MG capsule    Cameron Strahan, PA-C Surgical Short Stay/Anesthesiology [facility] Phone [phone] [facility] Phone [phone] 05/14/2024 3:47 PM

## 2024-05-14 NOTE — H&P (Signed)
 HPI:   Cameron Campbell is a 68 y.o. male who presents as a consult Patient.   Referring Provider: Elliot Charm,*  Chief complaint: Hoarseness.  HPI: He has been hoarse since the end of May. It has been consistent. He has no other symptoms. He did not have any upper respiratory infection at that time. He had neck surgery about 3 years ago but did not have problems after that. He does not smoke. He does drink coffee daily. He does not have heartburn. He does not use his voice excessively.  PMH/Meds/All/SocHx/FamHx/ROS:   History reviewed. No pertinent past medical history.  History reviewed. No pertinent surgical history.  No family history of bleeding disorders, wound healing problems or difficulty with anesthesia.     Current Outpatient Medications:  atorvastatin (LIPITOR) 10 mg tablet, take one tablet at bedtime. by mouth once a day for 90 days, Disp: , Rfl:  dextroamphetamine-amphetamine (ADDERALL) 10 mg tablet, Take 1 tablet by mouth., Disp: , Rfl:   A complete ROS was performed with pertinent positives/negatives noted in the HPI. The remainder of the ROS are negative.   Physical Exam:   Temp 97.6 F (36.4 C) (Temporal)  Resp 18  Ht 1.803 m (5' 11)  Wt 77 kg (169 lb 12.8 oz)  BMI 23.68 kg/m   General: Healthy and alert, in no distress, breathing easily. Normal affect. In a pleasant mood. Head: Normocephalic, atraumatic. No masses, or scars. Eyes: Pupils are equal, and reactive to light. Vision is grossly intact. No spontaneous or gaze nystagmus. Ears: Ear canals are clear. Tympanic membranes are intact, with normal landmarks and the middle ears are clear and healthy. Hearing: Grossly normal. Nose: Nasal cavities are clear with healthy mucosa, no polyps or exudate. Airways are patent. Face: No masses or scars, facial nerve function is symmetric. Oral Cavity: No mucosal abnormalities are noted. Tongue with normal mobility. Dentition appears healthy. Oropharynx:  Tonsils are symmetric. There are no mucosal masses identified. Tongue base appears normal and healthy. Larynx/Hypopharynx: Cords move well. There is a white papillary lesion, very small, along the contacting surface of the right mid vocal cord. No other lesions identified. Chest: Deferred Neck: No palpable masses, no cervical adenopathy, no thyroid nodules or enlargement. Neuro: Cranial nerves II-XII with normal function. Balance: Normal gate. Other findings: none.  Independent Review of Additional Tests or Records:  none  Procedures:  none  Impression & Plans:  Right cord lesion. Low risk of carcinoma but needs to be investigated. Recommend microlaryngoscopy with excision. Will schedule at his convenience.

## 2024-05-14 NOTE — Anesthesia Preprocedure Evaluation (Signed)
 Anesthesia Evaluation  Patient identified by MRN, date of birth, ID band Patient awake    Reviewed: Allergy & Precautions, NPO status , Patient's Chart, lab work & pertinent test results  History of Anesthesia Complications Negative for: history of anesthetic complications  Airway Mallampati: II  TM Distance: >3 FB Neck ROM: Full    Dental  (+) Poor Dentition   Pulmonary neg pulmonary ROS   Pulmonary exam normal        Cardiovascular negative cardio ROS Normal cardiovascular exam     Neuro/Psych    GI/Hepatic Neg liver ROS,GERD  Medicated,,  Endo/Other  negative endocrine ROS    Renal/GU negative Renal ROS     Musculoskeletal negative musculoskeletal ROS (+)    Abdominal   Peds  Hematology negative hematology ROS (+)   Anesthesia Other Findings Lesion of true vocal cord  Reproductive/Obstetrics                              Anesthesia Physical Anesthesia Plan  ASA: 2  Anesthesia Plan: General   Post-op Pain Management: Tylenol  PO (pre-op)*   Induction: Intravenous  PONV Risk Score and Plan: 2 and Treatment may vary due to age or medical condition, Ondansetron , Dexamethasone  and Midazolam   Airway Management Planned: Oral ETT  Additional Equipment: None  Intra-op Plan:   Post-operative Plan: Extubation in OR  Informed Consent: I have reviewed the patients History and Physical, chart, labs and discussed the procedure including the risks, benefits and alternatives for the proposed anesthesia with the patient or authorized representative who has indicated his/her understanding and acceptance.     Dental advisory given  Plan Discussed with: CRNA  Anesthesia Plan Comments: (PAT note written 05/14/2024 by Diem Dicocco, PA-C.  )         Anesthesia Quick Evaluation

## 2024-05-15 ENCOUNTER — Ambulatory Visit (HOSPITAL_COMMUNITY): Payer: Self-pay | Admitting: Vascular Surgery

## 2024-05-15 ENCOUNTER — Other Ambulatory Visit: Payer: Self-pay

## 2024-05-15 ENCOUNTER — Encounter (HOSPITAL_COMMUNITY): Payer: Self-pay | Admitting: Otolaryngology

## 2024-05-15 ENCOUNTER — Ambulatory Visit (HOSPITAL_COMMUNITY)
Admission: RE | Admit: 2024-05-15 | Discharge: 2024-05-15 | Disposition: A | Discharge status: Home / Self Care | Attending: Otolaryngology | Admitting: Otolaryngology

## 2024-05-15 ENCOUNTER — Encounter (HOSPITAL_COMMUNITY)
Admission: RE | Disposition: A | Discharge status: Home / Self Care | Payer: Self-pay | Source: Home / Self Care | Attending: Otolaryngology

## 2024-05-15 DIAGNOSIS — E785 Hyperlipidemia, unspecified: Secondary | ICD-10-CM | POA: Diagnosis not present

## 2024-05-15 DIAGNOSIS — K219 Gastro-esophageal reflux disease without esophagitis: Secondary | ICD-10-CM | POA: Insufficient documentation

## 2024-05-15 DIAGNOSIS — J382 Nodules of vocal cords: Secondary | ICD-10-CM | POA: Diagnosis not present

## 2024-05-15 DIAGNOSIS — C320 Malignant neoplasm of glottis: Secondary | ICD-10-CM | POA: Insufficient documentation

## 2024-05-15 DIAGNOSIS — J383 Other diseases of vocal cords: Secondary | ICD-10-CM

## 2024-05-15 HISTORY — DX: Malignant (primary) neoplasm, unspecified: C80.1

## 2024-05-15 HISTORY — DX: Hyperlipidemia, unspecified: E78.5

## 2024-05-15 HISTORY — DX: Gastro-esophageal reflux disease without esophagitis: K21.9

## 2024-05-15 HISTORY — PX: MICROLARYNGOSCOPY WITH LASER: SHX5972

## 2024-05-15 LAB — CBC
HCT: 41.4 % (ref 39.0–52.0)
Hemoglobin: 13.6 g/dL (ref 13.0–17.0)
MCH: 31.3 pg (ref 26.0–34.0)
MCHC: 32.9 g/dL (ref 30.0–36.0)
MCV: 95.2 fL (ref 80.0–100.0)
Platelets: 187 K/uL (ref 150–400)
RBC: 4.35 MIL/uL (ref 4.22–5.81)
RDW: 11.9 % (ref 11.5–15.5)
WBC: 4.4 K/uL (ref 4.0–10.5)
nRBC: 0 % (ref 0.0–0.2)

## 2024-05-15 SURGERY — MICROLARYNGOSCOPY, WITH PROCEDURE USING LASER
Anesthesia: General

## 2024-05-15 MED ORDER — 0.9 % SODIUM CHLORIDE (POUR BTL) OPTIME
TOPICAL | Status: DC | PRN
Start: 2024-05-15 — End: 2024-05-15
  Administered 2024-05-15: 1000 mL

## 2024-05-15 MED ORDER — ORAL CARE MOUTH RINSE: 15.0000 mL | Freq: Once | OROMUCOSAL | Status: AC

## 2024-05-15 MED ORDER — MIDAZOLAM HCL 2 MG/2ML IJ SOLN
INTRAMUSCULAR | Status: DC | PRN
  Administered 2024-05-15: 2 mg via INTRAVENOUS

## 2024-05-15 MED ORDER — LACTATED RINGERS IV SOLN: INTRAVENOUS | Status: DC

## 2024-05-15 MED ORDER — LIDOCAINE 2% (20 MG/ML) 5 ML SYRINGE
INTRAMUSCULAR | Status: DC | PRN
  Administered 2024-05-15: 100 mg via INTRAVENOUS

## 2024-05-15 MED ORDER — SUCCINYLCHOLINE CHLORIDE 200 MG/10ML IV SOSY
PREFILLED_SYRINGE | INTRAVENOUS | Status: DC | PRN
  Administered 2024-05-15: 140 mg via INTRAVENOUS

## 2024-05-15 MED ORDER — ROCURONIUM BROMIDE 10 MG/ML (PF) SYRINGE
PREFILLED_SYRINGE | INTRAVENOUS | Status: DC | PRN
  Administered 2024-05-15: 20 mg via INTRAVENOUS
  Administered 2024-05-15: 10 mg via INTRAVENOUS

## 2024-05-15 MED ORDER — SUGAMMADEX SODIUM 200 MG/2ML IV SOLN
INTRAVENOUS | Status: DC | PRN
  Administered 2024-05-15: 100 mg via INTRAVENOUS
  Administered 2024-05-15: 200 mg via INTRAVENOUS

## 2024-05-15 MED ORDER — FENTANYL CITRATE (PF) 250 MCG/5ML IJ SOLN
INTRAMUSCULAR | Status: AC
Start: 2024-05-15 — End: 2024-05-15
  Filled 2024-05-15: qty 5

## 2024-05-15 MED ORDER — PHENYLEPHRINE HCL-NACL 20-0.9 MG/250ML-% IV SOLN
INTRAVENOUS | Status: DC | PRN
  Administered 2024-05-15: 50 ug/min via INTRAVENOUS

## 2024-05-15 MED ORDER — ACETAMINOPHEN 500 MG PO TABS
1000.0000 mg | ORAL_TABLET | Freq: Once | ORAL | Status: AC
  Administered 2024-05-15: 1000 mg via ORAL
  Filled 2024-05-15: qty 2

## 2024-05-15 MED ORDER — FENTANYL CITRATE (PF) 100 MCG/2ML IJ SOLN: 25.0000 ug | INTRAMUSCULAR | Status: DC | PRN

## 2024-05-15 MED ORDER — DEXAMETHASONE SODIUM PHOSPHATE 10 MG/ML IJ SOLN
INTRAMUSCULAR | Status: DC | PRN
  Administered 2024-05-15: 10 mg via INTRAVENOUS

## 2024-05-15 MED ORDER — EPINEPHRINE HCL (NASAL) 0.1 % NA SOLN
NASAL | Status: AC
  Filled 2024-05-15: qty 30

## 2024-05-15 MED ORDER — ONDANSETRON HCL 4 MG/2ML IJ SOLN
INTRAMUSCULAR | Status: DC | PRN
  Administered 2024-05-15: 4 mg via INTRAVENOUS

## 2024-05-15 MED ORDER — PROPOFOL 10 MG/ML IV BOLUS
INTRAVENOUS | Status: AC
  Filled 2024-05-15: qty 20

## 2024-05-15 MED ORDER — FENTANYL CITRATE (PF) 250 MCG/5ML IJ SOLN
INTRAMUSCULAR | Status: DC | PRN
  Administered 2024-05-15 (×2): 50 ug via INTRAVENOUS

## 2024-05-15 MED ORDER — CHLORHEXIDINE GLUCONATE 0.12 % MT SOLN
15.0000 mL | Freq: Once | OROMUCOSAL | Status: AC
  Administered 2024-05-15: 15 mL via OROMUCOSAL
  Filled 2024-05-15: qty 15

## 2024-05-15 MED ORDER — LIDOCAINE-EPINEPHRINE 1 %-1:100000 IJ SOLN
INTRAMUSCULAR | Status: DC | PRN
  Administered 2024-05-15: .5 mL

## 2024-05-15 MED ORDER — MIDAZOLAM HCL 2 MG/2ML IJ SOLN
INTRAMUSCULAR | Status: AC
Start: 2024-05-15 — End: 2024-05-15
  Filled 2024-05-15: qty 2

## 2024-05-15 MED ORDER — OXYCODONE HCL 5 MG PO TABS: 5.0000 mg | ORAL_TABLET | Freq: Once | ORAL | Status: DC | PRN

## 2024-05-15 MED ORDER — OXYMETAZOLINE HCL 0.05 % NA SOLN
NASAL | Status: AC
  Filled 2024-05-15: qty 30

## 2024-05-15 MED ORDER — PROPOFOL 10 MG/ML IV BOLUS
INTRAVENOUS | Status: DC | PRN
  Administered 2024-05-15: 150 mg via INTRAVENOUS

## 2024-05-15 MED ORDER — DROPERIDOL 2.5 MG/ML IJ SOLN: 0.6250 mg | Freq: Once | INTRAMUSCULAR | Status: DC | PRN

## 2024-05-15 MED ORDER — OXYCODONE HCL 5 MG/5ML PO SOLN: 5.0000 mg | Freq: Once | ORAL | Status: DC | PRN

## 2024-05-15 MED ORDER — EPINEPHRINE PF 1 MG/ML IJ SOLN
INTRAMUSCULAR | Status: DC | PRN
  Administered 2024-05-15: 10 mL

## 2024-05-15 SURGICAL SUPPLY — 26 items
BAG COUNTER SPONGE SURGICOUNT (BAG) ×2 IMPLANT
BLADE SURG 15 STRL LF DISP TIS (BLADE) IMPLANT
CANISTER SUCTION 3000ML PPV (SUCTIONS) ×2 IMPLANT
DEFOGGER MIRROR 1QT (MISCELLANEOUS) ×2 IMPLANT
DRAPE HALF SHEET 40X57 (DRAPES) ×1 IMPLANT
GAUZE SPONGE 4X4 12PLY STRL (GAUZE/BANDAGES/DRESSINGS) IMPLANT
GLOVE ECLIPSE 7.5 STRL STRAW (GLOVE) ×2 IMPLANT
GOWN STRL REUS W/ TWL XL LVL3 (GOWN DISPOSABLE) IMPLANT
GOWN STRL REUS W/TWL 2XL LVL3 (GOWN DISPOSABLE) IMPLANT
GUARD TEETH (MISCELLANEOUS) IMPLANT
KIT BASIN OR (CUSTOM PROCEDURE TRAY) ×2 IMPLANT
MARKER SKIN DUAL TIP RULER LAB (MISCELLANEOUS) IMPLANT
NDL 22X1.5 STRL (OR ONLY) (MISCELLANEOUS) IMPLANT
NDL HYPO 18GX1.5 BLUNT FILL (NEEDLE) ×2 IMPLANT
NDL PRECISIONGLIDE 27X1.5 (NEEDLE) IMPLANT
NEEDLE 22X1.5 STRL (OR ONLY) (MISCELLANEOUS) IMPLANT
NEEDLE HYPO 18GX1.5 BLUNT FILL (NEEDLE) ×1 IMPLANT
NEEDLE PRECISIONGLIDE 27X1.5 (NEEDLE) IMPLANT
NS IRRIG 1000ML POUR BTL (IV SOLUTION) ×1 IMPLANT
PATTIES SURGICAL .5 X3 (DISPOSABLE) ×2 IMPLANT
SURGILUBE 2OZ TUBE FLIPTOP (MISCELLANEOUS) IMPLANT
SYR 5ML LL (SYRINGE) ×1 IMPLANT
SYR CONTROL 10ML LL (SYRINGE) IMPLANT
TOWEL GREEN STERILE FF (TOWEL DISPOSABLE) ×1 IMPLANT
TUBE CONNECTING 20X1/4 (TUBING) ×2 IMPLANT
WATER STERILE IRR 1000ML POUR (IV SOLUTION) ×1 IMPLANT

## 2024-05-15 NOTE — Interval H&P Note (Signed)
 History and Physical Interval Note:  05/15/2024 10:48 AM  Cameron Campbell  has presented today for surgery, with the diagnosis of Lesion of true vocal cord.  The various methods of treatment have been discussed with the patient and family. After consideration of risks, benefits and other options for treatment, the patient has consented to  Procedure(s): MICROLARYNGOSCOPY, WITH PROCEDURE USING LASER (N/A) as a surgical intervention.  The patient's history has been reviewed, patient examined, no change in status, stable for surgery.  I have reviewed the patient's chart and labs.  Questions were answered to the patient's satisfaction.     [name]

## 2024-05-15 NOTE — Discharge Instructions (Addendum)
 Resume diet as tolerated.  Resume activities as tolerated.  Okay to talk in a nice relaxed and easy voice.  Avoid screaming, straining the voice, whispering, singing.

## 2024-05-15 NOTE — Op Note (Signed)
 OPERATIVE REPORT  DATE OF SURGERY: 05/15/2024  PATIENT:  Cameron Campbell,  68 y.o. male  PRE-OPERATIVE DIAGNOSIS:  Lesion of true vocal cord, right  POST-OPERATIVE DIAGNOSIS:  Lesion of true vocal cord, right  PROCEDURE:  Procedure(s): MICROLARYNGOSCOPY WITH EXCISION BIOPSY, right  SURGEON:  Ida VEAR Loader, MD  ASSISTANTS: none  ANESTHESIA:   General   EBL: 10 ml  DRAINS: none  LOCAL MEDICATIONS USED:  None  SPECIMEN: Right vocal cord lesion  COUNTS:  Correct  PROCEDURE DETAILS: The patient was taken to the operating room and placed on the operating table in the supine position. Following induction of general endotracheal anesthesia, the table was turned 90 and the patient was draped in a standard fashion.  A maxillary tooth protector was used throughout the procedure.  Lang laryngoscope was entered into the oral cavity used to evaluate the larynx.  This was attached to the mass and with the suspension apparatus.  The microscope was brought into the field for the remainder of the case.  Photographs were taken with a rod telescope.  1% Xylocaine  with epinephrine  was infiltrated into the right cord using a spinal needle, 25-gauge.  The lesion was pale, papillary and extended from approximately 30% anterior to the posterior cord, extending until approximately 1 or 2 mm posterior to the anterior commissure, superiorly to the superior mucosal surface of the cord but not extending into the ventricle, and inferiorly to just below the contacting surface of the cord.  Entire lesion was removed using microlaryngoscopy scissors to dissected free of surrounding mucosa and off of the vocal ligament.  No other abnormal findings were noted.  Topical adrenaline was used for hemostasis.  The specimen was sent for pathologic evaluation.  Scope was removed and the patient was extubated awakened and transferred to recovery in stable condition.    PATIENT DISPOSITION:  To PACU, stable

## 2024-05-15 NOTE — Anesthesia Procedure Notes (Signed)
 Procedure Name: Intubation Date/Time: 05/15/2024 11:49 AM  Performed by: Virgil Ee, CRNAPre-anesthesia Checklist: Patient identified, Patient being monitored, Timeout performed, Emergency Drugs available and Suction available Patient Re-evaluated:Patient Re-evaluated prior to induction Oxygen Delivery Method: Circle system utilized Preoxygenation: Pre-oxygenation with 100% oxygen Induction Type: IV induction Ventilation: Mask ventilation without difficulty Laryngoscope Size: Mac and 4 Grade View: Grade I Tube type: Oral Tube size: 7.0 mm Number of attempts: 1 Airway Equipment and Method: Stylet Placement Confirmation: ETT inserted through vocal cords under direct vision, positive ETCO2 and breath sounds checked- equal and bilateral Secured at: 23 cm Tube secured with: Tape Dental Injury: Teeth and Oropharynx as per pre-operative assessment

## 2024-05-15 NOTE — Anesthesia Postprocedure Evaluation (Signed)
 Anesthesia Post Note  Patient: Cameron Campbell  Procedure(s) Performed: MICROLARYNGOSCOPY WITH BIOPSY     Patient location during evaluation: PACU Anesthesia Type: General Level of consciousness: awake and alert Pain management: pain level controlled Vital Signs Assessment: post-procedure vital signs reviewed and stable Respiratory status: spontaneous breathing, nonlabored ventilation and respiratory function stable Cardiovascular status: blood pressure returned to baseline Postop Assessment: no apparent nausea or vomiting Anesthetic complications: no   No notable events documented.  Last Vitals:  Vitals:   05/15/24 1300 05/15/24 1310  BP: 122/66 114/71  Pulse: (!) 56 64  Resp: 14 10  Temp:  (!) 36.3 C  SpO2: 95% 94%    Last Pain:  Vitals:   05/15/24 1245  TempSrc:   PainSc: 0-No pain                 Vertell Row

## 2024-05-15 NOTE — Transfer of Care (Signed)
 Immediate Anesthesia Transfer of Care Note  Patient: Cameron Campbell  Procedure(s) Performed: MICROLARYNGOSCOPY WITH BIOPSY  Patient Location: PACU  Anesthesia Type:General  Level of Consciousness: drowsy  Airway & Oxygen Therapy: Patient Spontanous Breathing  Post-op Assessment: Report given to RN and Post -op Vital signs reviewed and stable  Post vital signs: Reviewed and stable  Last Vitals:  Vitals Value Taken Time  BP 125/68 05/15/24 12:42  Temp 36.5 C 05/15/24 12:42  Pulse 64 05/15/24 12:44  Resp 12 05/15/24 12:43  SpO2 95 % 05/15/24 12:44  Vitals shown include unfiled device data.  Last Pain:  Vitals:   05/15/24 0931  TempSrc: Oral         Complications: No notable events documented.

## 2024-05-16 ENCOUNTER — Encounter (HOSPITAL_COMMUNITY): Payer: Self-pay | Admitting: Otolaryngology

## 2024-05-16 LAB — SURGICAL PATHOLOGY

## 2024-05-20 NOTE — Progress Notes (Signed)
 Radiation Oncology         [phone] ________________________________  Initial outpatient Consultation  Name: Cameron Campbell MRN: [id number]  Date: 05/21/2024  DOB: [birth year]  RR:Cjmjijmjgjw, Valery, MD  Jesus Oliphant, MD   REFERRING PHYSICIAN: Jesus Oliphant, MD  DIAGNOSIS: No diagnosis found.   Cancer Staging  No matching staging information was found for the patient.  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 male who presented with worsening voice hoarseness .  Of note, patient was seen in July of 2024 for similar complains. He was found to have a right cord lesion. Patient then underwent a microlaryngoscopy with excision of vocal cord lesion on 06/05/23 under the care of Dr. Jesus. Surgical pathology at that time indicated squamous mucosa with moderate dysplasia in a background of chronic inflammation.   Recently, he presented to Dr. Karis on 04/03/24 complaining of frequent, recurrent epistaxis. He was then seen by Dr. Jesus on 04/16/24. Indirect laryngoscopy revealed a white plaque like lesion involving the right mid vocal cord. He was then treated with aggressive antireflux treatment without much relief.     Subsequently, he underwent a microlaryngoscopy and excisional biopsy on 05/15/24 under the care of Dr. Jesus. Surgical pathology indicated well to moderately differentiated invasive squamous cell carcinoma.    Swallowing issues, if any: ***  Weight Changes: ***  Pain status: ***  Other symptoms: ***  Tobacco history, if any: none   ETOH abuse, if any: couple of beers a week  Prior cancers, if any: none   PREVIOUS RADIATION THERAPY: No  PAST MEDICAL HISTORY:  has a past medical history of Abnormal prostate biopsy, Achilles tendinitis, BPPV (benign paroxysmal positional vertigo) (10/2007), Cancer (HCC),  Elevated LFTs, Genital herpes, GERD (gastroesophageal reflux disease), HLD (hyperlipidemia), Lesion of vocal cord (05/16/2023), Lumbar radiculopathy, and Onychomycosis.    PAST SURGICAL HISTORY: Past Surgical History:  Procedure Laterality Date   APPENDECTOMY     COLONOSCOPY WITH PROPOFOL  N/A 11/15/2016   Procedure: COLONOSCOPY WITH PROPOFOL ;  Surgeon: Gladis MARLA Louder, MD;  Location: [facility];  Service: Endoscopy;  Laterality: N/A;   MICROLARYNGOSCOPY Right 06/05/2023   Procedure: Microlaryngoscopy with Excision of Vocal Cord Lesion;  Surgeon: Jesus Oliphant, MD;  Location: [facility];  Service: ENT;  Laterality: Right;   MICROLARYNGOSCOPY WITH LASER N/A 05/15/2024   Procedure: MICROLARYNGOSCOPY WITH BIOPSY;  Surgeon: Jesus Oliphant, MD;  Location: [facility] OR;  Service: ENT;  Laterality: N/A;    FAMILY HISTORY: family history includes AAA (abdominal aortic aneurysm) in his father; CAD in his father; Crohn's disease in his brother and brother; Healthy in his brother, brother, sister, and sister; Heart attack in his father; Myelodysplastic syndrome in his mother; Non-Hodgkin's lymphoma in his mother; Other in his father.  SOCIAL HISTORY:  reports that he has never smoked. He has never used smokeless tobacco. He reports current alcohol use. He reports that he does not use drugs.  ALLERGIES: Patient has no known allergies.  MEDICATIONS:  Current Outpatient Medications  Medication Sig Dispense Refill   atorvastatin (LIPITOR) 20 MG tablet Take 10 mg by mouth at bedtime.     omeprazole (PRILOSEC) 20 MG capsule Take 20 mg by mouth daily. (Patient not taking: Reported on 05/14/2024)     No current facility-administered medications for this encounter.    REVIEW OF SYSTEMS:  Notable for that above.  PHYSICAL EXAM:  vitals were not taken for this visit.   General: Alert and oriented, in no acute distress HEENT: Head is normocephalic. Extraocular movements are intact. Oropharynx is  notable for ***. Neck: Neck is notable for *** Heart: Regular in rate and rhythm with no murmurs, rubs, or gallops. Chest: Clear to auscultation bilaterally, with no rhonchi, wheezes, or rales. Abdomen: Soft, nontender, nondistended, with no rigidity or guarding. Extremities: No cyanosis or edema. Lymphatics: see Neck Exam Skin: No concerning lesions. Musculoskeletal: symmetric strength and muscle tone throughout. Neurologic: Cranial nerves II through XII are grossly intact. No obvious focalities. Speech is fluent. Coordination is intact. Psychiatric: Judgment and insight are intact. Affect is appropriate.   ECOG = ***  0 - Asymptomatic (Fully active, able to carry on all predisease activities without restriction)  1 - Symptomatic but completely ambulatory (Restricted in physically strenuous activity but ambulatory and able to carry out work of a light or sedentary nature. For example, light housework, office work)  2 - Symptomatic, <50% in bed during the day (Ambulatory and capable of all self care but unable to carry out any work activities. Up and about more than 50% of waking hours)  3 - Symptomatic, >50% in bed, but not bedbound (Capable of only limited self-care, confined to bed or chair 50% or more of waking hours)  4 - Bedbound (Completely disabled. Cannot carry on any self-care. Totally confined to bed or chair)  5 - Death   Raylene MM, Creech RH, Tormey DC, et al. [phone]). Toxicity and response criteria of the [facility]. Am. DOROTHA Bridges. Oncol. 5 (6): 649-55   LABORATORY DATA:  Lab Results  Component Value Date   WBC 4.4 05/15/2024   HGB 13.6 05/15/2024   HCT 41.4 05/15/2024   MCV 95.2 05/15/2024   PLT 187 05/15/2024   CMP  No results found for: NA, K, CL, CO2, GLUCOSE, BUN, CREATININE, CALCIUM, PROT, ALBUMIN, AST, ALT, ALKPHOS, BILITOT, GFRNONAA, GFRAA    No results found for: TSH   RADIOGRAPHY: No results  found.    IMPRESSION/PLAN:  This is a delightful patient with head and neck cancer. I *** recommend radiotherapy for this patient.  We discussed the potential risks, benefits, and side effects of radiotherapy. We talked in detail about acute and late effects. We discussed that some of the most bothersome acute effects may be mucositis, dysgeusia, salivary changes, skin irritation, hair loss, dehydration, weight loss and fatigue. We talked about late effects which include but are not necessarily limited to dysphagia, hypothyroidism, nerve injury, vascular injury, spinal cord injury, xerostomia, trismus, neck edema, dental issues, non-healing wound, and potentially fatal injury to any of the tissues in the head and neck region. No guarantees of treatment were given. A consent form was signed and placed in the patient's medical record. The patient is enthusiastic about proceeding with treatment. I look forward to participating in the patient's care.    Simulation (treatment planning) will take place ***  We also discussed that the treatment of head and neck cancer is a multidisciplinary process to maximize treatment outcomes and quality of life. For this reason the following referrals have been or will be made:  *** Medical oncology to discuss chemotherapy   *** Dentistry for dental evaluation, possible extractions in the radiation fields, and /or advice on reducing risk of cavities, osteoradionecrosis, or other oral issues.  *** Nutritionist for nutrition support during and after treatment.  *** Speech language pathology for swallowing and/or  speech therapy.  *** Social work for social support.   *** Physical therapy due to risk of lymphedema in neck and deconditioning.  *** Baseline labs including TSH.  On date of service, in total, I spent *** minutes on this encounter. Patient was seen in person.  __________________________________________   Lauraine Golden, MD  This document serves as  a record of services personally performed by Lauraine Golden, MD. It was created on her behalf by Reymundo Cartwright, a trained medical scribe. The creation of this record is based on the scribe's personal observations and the provider's statements to them. This document has been checked and approved by the attending provider.

## 2024-05-21 ENCOUNTER — Encounter: Payer: Self-pay | Admitting: Radiation Oncology

## 2024-05-21 ENCOUNTER — Other Ambulatory Visit: Payer: Self-pay

## 2024-05-21 ENCOUNTER — Ambulatory Visit
Admission: RE | Admit: 2024-05-21 | Discharge: 2024-05-21 | Disposition: A | Discharge status: Home / Self Care | Source: Ambulatory Visit | Attending: Radiation Oncology | Admitting: Radiation Oncology

## 2024-05-21 ENCOUNTER — Telehealth: Payer: Self-pay | Admitting: Radiation Oncology

## 2024-05-21 VITALS — BP 135/73 | HR 60 | Temp 97.1°F | Resp 18 | Ht 70.0 in | Wt 175.2 lb

## 2024-05-21 DIAGNOSIS — K219 Gastro-esophageal reflux disease without esophagitis: Secondary | ICD-10-CM | POA: Insufficient documentation

## 2024-05-21 DIAGNOSIS — C320 Malignant neoplasm of glottis: Secondary | ICD-10-CM | POA: Insufficient documentation

## 2024-05-21 DIAGNOSIS — C090 Malignant neoplasm of tonsillar fossa: Secondary | ICD-10-CM

## 2024-05-21 DIAGNOSIS — E785 Hyperlipidemia, unspecified: Secondary | ICD-10-CM | POA: Diagnosis not present

## 2024-05-21 DIAGNOSIS — R490 Dysphonia: Secondary | ICD-10-CM | POA: Diagnosis not present

## 2024-05-21 DIAGNOSIS — Z79899 Other long term (current) drug therapy: Secondary | ICD-10-CM | POA: Insufficient documentation

## 2024-05-21 NOTE — Telephone Encounter (Signed)
 Scheduled patient for Nutrition appointment per Dr. Charisse referral. Called patient to notify patient of appointment, no answer, unable to LVM, VM not setup.

## 2024-05-21 NOTE — Progress Notes (Signed)
 Oncology Nurse Navigator Documentation   Met with patient before initial consult with Dr. Izell. He was accompanied by his wife.  I introduced myself as his/their Navigator, explained my role as a member of the Care Team. Provided New Patient resource guide binder: Contact information for physicians, this navigator, other members of the Care Team Advance Directive information; provided [facility] AD booklet at their request,  Fall Prevention Patient Safety Plan Financial Assistance Information sheet Symptom Management Clinic information [facility] map with highlight of [facility] SLP Information sheet Head and Neck cancer basics Nutrition information Patient and family support information including Spiritual care/Chaplain information, Peer mentor program, health and wellness classes, and the survivorship program Community resources  Assisted with post-consult appt scheduling. They verbalized understanding of information provided. I encouraged them to call with questions/concerns moving forward.  [name], RN, BSN, OCN Head & Neck Oncology Nurse Navigator [facility] [place] [phone]

## 2024-05-21 NOTE — Progress Notes (Signed)
 Head and Neck Cancer Location of Tumor / Histology:  Malignant Neoplasm of Larynx  Patient presented 12 months ago with symptoms of:  Voice hoarseness Recently patient has been complaining of frequent  recurrent epistaxis  Biopsies revealed:    Nutrition Status Yes No Comments  Weight changes? []  [x]    Swallowing concerns? []  [x]    PEG? []  [x]     Referrals Yes No Comments  Social Work? [x]  []    Dentistry? [x]  []    Swallowing therapy? [x]  []    Nutrition? [x]  []    Med/Onc? [x]  []     Safety Issues Yes No Comments  Prior radiation? []  [x]    Pacemaker/ICD? []  [x]    Possible current pregnancy? []  [x]    Is the patient on methotrexate? []  [x]     Tobacco/Marijuana/Snuff/ETOH use:  Drinks beer occasionally  Past/Anticipated interventions by otolaryngology, if any:  Microlaryngoscopy with Biopsy -Surgical Pathology indicated Moderately Differentiated Invasive Squamous Cell Carcinoma Past/Anticipated interventions by oncology, if any:  Radiation     Current Complaints / other details:   None BP 135/73 (BP Location: Left Arm, Patient Position: Sitting)   Pulse 60   Temp (!) 97.1 F (36.2 C) (Temporal)   Resp 18   Ht 5' 10 (1.778 m)   Wt 175 lb 4 oz (79.5 kg)   SpO2 98%   BMI 25.15 kg/m   Wt Readings from Last 3 Encounters:  05/21/24 175 lb 4 oz (79.5 kg)  05/15/24 175 lb (79.4 kg)  06/05/23 171 lb 4.8 oz (77.7 kg)

## 2024-05-22 ENCOUNTER — Telehealth: Payer: Self-pay

## 2024-05-22 ENCOUNTER — Other Ambulatory Visit: Payer: Self-pay

## 2024-05-22 ENCOUNTER — Ambulatory Visit
Admission: RE | Admit: 2024-05-22 | Discharge: 2024-05-22 | Disposition: A | Discharge status: Home / Self Care | Source: Ambulatory Visit | Attending: Radiation Oncology | Admitting: Radiation Oncology

## 2024-05-22 DIAGNOSIS — C320 Malignant neoplasm of glottis: Secondary | ICD-10-CM | POA: Diagnosis not present

## 2024-05-22 NOTE — Progress Notes (Signed)
 Oncology Nurse Navigator Documentation   Cameron Campbell presented for CT simulation today. He tolerated without any difficulties. He and his wife know to call me if they have any questions or concerns before his start date on 06/03/24.  [name] RN, BSN, OCN Head & Neck Oncology Nurse Navigator [facility] Phone # [phone]  Fax # [phone]

## 2024-05-22 NOTE — Telephone Encounter (Signed)
 [facility] Clinical Social Work  Clinical Social Work was referred by medical provider for assessment of psychosocial needs (New Patient Assessment)  Clinical Social Worker attempted to contact patient by phone to offer support and assess for needs.    Interventions: Patient's VM is not set up.      Follow Up Plan:  CSW will attempt again.    Lizbeth Sprague, LCSW  Clinical Social Worker [facility]

## 2024-05-23 ENCOUNTER — Inpatient Hospital Stay: Attending: Radiation Oncology

## 2024-05-23 NOTE — Progress Notes (Signed)
 Oncology Nurse Navigator Documentation   I called Mr. Hibberd today to inform him of his lab appointment for a TSH level before his first radiation treatment on 8/11 at 3:45. He voiced that he is agreeable to come at that date and time. He has my contact information as needed for any questions or concerns in the future.   [name] RN, BSN, OCN Head & Neck Oncology Nurse Navigator  [facility] at [facility] Phone # [phone]  Fax # [phone]

## 2024-05-23 NOTE — Progress Notes (Signed)
 [facility] Clinical Social Work  Initial Assessment   Cameron Campbell is a 68 y.o. year old male contacted by phone. Clinical Social Work was referred by new patient protocol for assessment of psychosocial needs.   SDOH (Social Determinants of Health) assessments performed: No   SDOH Screenings   Food Insecurity: No Food Insecurity (05/21/2024)  Housing: Low Risk  (05/21/2024)  Transportation Needs: No Transportation Needs (05/21/2024)  Utilities: Not At Risk (05/21/2024)  Depression (PHQ2-9): Low Risk  (05/21/2024)  Tobacco Use: Low Risk  (05/21/2024)     Distress Screen completed: No     No data to display            Family/Social Information:  Housing Arrangement: patient lives with his spouse.  Family members/support persons in your life? Patient described his family as wonderful and a great system of support. Patient identified his daughter (expecting their first grandchild), future son-in-law, and his detailed oriented spouse.  Transportation concerns: Patient will be transported by his spouse. If his spouse is unable to drive, patient will attempt to drive himself.  Employment: Unknown / not assessed Income source:  Financial concerns: No Type of concern: None Food access concerns: no Religious or spiritual practice: Patient described a grounded belief in God but not attached to a particular domination. Patient described beliefs of cycles of life and diverse beliefs. No spiritual distress noted at time of call.   Advanced directives: MOST Form Completed  Services Currently in place:  insurance transportation family   Coping/ Adjustment to diagnosis: Patient understands treatment plan and what happens next? Yes. Patient understands the prognosis of a curative goal. Patient reported being optimistic about treatment.   Concerns about diagnosis and/or treatment: No specific concerns about treatment.  Patient reported stressors: Patient denied any reported stressors at this  time. Patient described periodic feelings of being bummed due to general idea of having to complete treatment. However, patient stated these feelings evaporate over time.  Hopes and/or priorities: for successful treatment Patient enjoys being outside and time with family/ friends Current coping skills/ strengths: Ability for insight , Active sense of humor , Average or above average intelligence , Capable of independent living , Communication skills , Financial means , General fund of knowledge , Motivation for treatment/growth , Physical Health , Religious Affiliation , and Supportive family/friends . Patient has a deep familiarity with meditation practices.     SUMMARY: Current SDOH Barriers:  No SDOH barreirs at this time  Clinical Social Work Clinical Goal(s):  Patient will work with SW to address concerns related to adjustment to diagnosis.   Interventions: Discussed common feeling and emotions when being diagnosed with cancer, and the importance of support during treatment Informed patient of the support team roles and support services at [facility] Provided CSW contact information and encouraged patient to call with any questions or concerns   Follow Up Plan: CSW will follow-up with patient by phone for continued support with adjustment to diagnosis.  Patient verbalizes understanding of plan: Yes  Lizbeth Sprague, LCSW Clinical Social Worker [facility]

## 2024-05-24 ENCOUNTER — Ambulatory Visit
Admission: RE | Admit: 2024-05-24 | Discharge: 2024-05-24 | Disposition: A | Discharge status: Home / Self Care | Source: Ambulatory Visit | Attending: Radiation Oncology | Admitting: Radiation Oncology

## 2024-05-24 DIAGNOSIS — C090 Malignant neoplasm of tonsillar fossa: Secondary | ICD-10-CM

## 2024-05-24 DIAGNOSIS — Z79899 Other long term (current) drug therapy: Secondary | ICD-10-CM | POA: Diagnosis not present

## 2024-05-24 DIAGNOSIS — Z510 Encounter for antineoplastic radiation therapy: Secondary | ICD-10-CM | POA: Insufficient documentation

## 2024-05-24 DIAGNOSIS — C320 Malignant neoplasm of glottis: Secondary | ICD-10-CM | POA: Diagnosis not present

## 2024-06-03 ENCOUNTER — Ambulatory Visit: Admitting: Radiation Oncology

## 2024-06-03 ENCOUNTER — Other Ambulatory Visit: Payer: Self-pay

## 2024-06-03 ENCOUNTER — Ambulatory Visit
Admission: RE | Admit: 2024-06-03 | Discharge: 2024-06-03 | Disposition: A | Discharge status: Home / Self Care | Source: Ambulatory Visit | Attending: Radiation Oncology | Admitting: Radiation Oncology

## 2024-06-03 ENCOUNTER — Inpatient Hospital Stay
Admission: RE | Admit: 2024-06-03 | Discharge: 2024-06-03 | Disposition: A | Discharge status: Home / Self Care | Payer: Self-pay | Source: Ambulatory Visit | Attending: Radiation Oncology | Admitting: Radiation Oncology

## 2024-06-03 ENCOUNTER — Other Ambulatory Visit: Payer: Self-pay | Admitting: Radiation Oncology

## 2024-06-03 DIAGNOSIS — C090 Malignant neoplasm of tonsillar fossa: Secondary | ICD-10-CM

## 2024-06-03 DIAGNOSIS — C320 Malignant neoplasm of glottis: Secondary | ICD-10-CM | POA: Diagnosis not present

## 2024-06-03 DIAGNOSIS — Z510 Encounter for antineoplastic radiation therapy: Secondary | ICD-10-CM | POA: Diagnosis not present

## 2024-06-03 DIAGNOSIS — Z79899 Other long term (current) drug therapy: Secondary | ICD-10-CM | POA: Diagnosis not present

## 2024-06-03 LAB — RAD ONC ARIA SESSION SUMMARY
Course Elapsed Days: 0
Plan Fractions Treated to Date: 1
Plan Prescribed Dose Per Fraction: 2.25 Gy
Plan Total Fractions Prescribed: 28
Plan Total Prescribed Dose: 63 Gy
Reference Point Dosage Given to Date: 2.25 Gy
Reference Point Session Dosage Given: 2.25 Gy
Session Number: 1

## 2024-06-03 MED ORDER — SONAFINE EX EMUL: 1.0000 | Freq: Two times a day (BID) | CUTANEOUS | Status: DC

## 2024-06-03 MED ORDER — LIDOCAINE VISCOUS HCL 2 % MT SOLN: OROMUCOSAL | 3 refills | Status: AC

## 2024-06-03 NOTE — Progress Notes (Signed)
 Oncology Nurse Navigator Documentation   To provide support, encouragement and care continuity, met with [name] after his initial RT.   I reviewed the 2-step treatment process, answered questions.  [name] completed treatment without difficulty, denied questions/concerns. I reviewed the registration/arrival procedure for subsequent treatments. I encouraged them to call me with questions/concerns as treatments proceed.   Cameron Jefferson RN, BSN, OCN Head & Neck Oncology Nurse Navigator [facility] Phone # [phone]  Fax # [phone]

## 2024-06-04 ENCOUNTER — Ambulatory Visit
Admission: RE | Admit: 2024-06-04 | Discharge: 2024-06-04 | Disposition: A | Discharge status: Home / Self Care | Source: Ambulatory Visit | Attending: Radiation Oncology

## 2024-06-04 ENCOUNTER — Other Ambulatory Visit: Payer: Self-pay

## 2024-06-04 ENCOUNTER — Inpatient Hospital Stay: Attending: Radiation Oncology | Admitting: Nutrition

## 2024-06-04 DIAGNOSIS — C320 Malignant neoplasm of glottis: Secondary | ICD-10-CM

## 2024-06-04 DIAGNOSIS — Z510 Encounter for antineoplastic radiation therapy: Secondary | ICD-10-CM | POA: Diagnosis not present

## 2024-06-04 DIAGNOSIS — Z79899 Other long term (current) drug therapy: Secondary | ICD-10-CM | POA: Diagnosis not present

## 2024-06-04 LAB — RAD ONC ARIA SESSION SUMMARY
Course Elapsed Days: 1
Plan Fractions Treated to Date: 2
Plan Prescribed Dose Per Fraction: 2.25 Gy
Plan Total Fractions Prescribed: 28
Plan Total Prescribed Dose: 63 Gy
Reference Point Dosage Given to Date: 4.5 Gy
Reference Point Session Dosage Given: 2.25 Gy
Session Number: 2

## 2024-06-04 LAB — TSH: TSH: 1.65 u[IU]/mL (ref 0.350–4.500)

## 2024-06-04 NOTE — Progress Notes (Signed)
 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 Izell.  Final radiation treatment is scheduled for September 18.  Past medical history includes GERD, hyperlipidemia  Medications include lidocaine , Prilosec  Labs reviewed.  Height: 5 feet 10 inches. Weight: 176 pounds August 12. Usual body weight: 175 pounds per patient. BMI: 25.25.  Patient has completed 1 radiation treatments.  He has not lost any weight prior to diagnosis and he is eating normally.  He does not report any food allergies or intolerances.  No muscle or fat loss on physical exam.  Nutrition diagnosis: Predicted sub-optimal energy intake related to larynx cancer and associated treatments as evidenced by a condition for which history shows an increased incidence of sub-optimal energy intake.  Intervention: Educated on importance of weight maintenance throughout treatment. Consume small frequent meals and snacks with adequate calories and protein. Consider milkshakes if weight loss occurs. Reviewed strategies for swallowing if this becomes difficult throughout treatment. Provided nutrition fact sheets.  Contact information given.  Questions were answered.  Monitoring, evaluation, goals: Patient will tolerate adequate calories and protein to minimize weight loss.  Next visit: Thursday, August 21 after radiation therapy at 315.  **Disclaimer: This note was dictated with voice recognition software. Similar sounding words can inadvertently be transcribed and this note may contain transcription errors which may not have been corrected upon publication of note.**

## 2024-06-05 ENCOUNTER — Other Ambulatory Visit: Payer: Self-pay

## 2024-06-05 ENCOUNTER — Ambulatory Visit
Admission: RE | Admit: 2024-06-05 | Discharge: 2024-06-05 | Disposition: A | Discharge status: Home / Self Care | Source: Ambulatory Visit | Attending: Radiation Oncology | Admitting: Radiation Oncology

## 2024-06-05 DIAGNOSIS — Z510 Encounter for antineoplastic radiation therapy: Secondary | ICD-10-CM | POA: Diagnosis not present

## 2024-06-05 DIAGNOSIS — C320 Malignant neoplasm of glottis: Secondary | ICD-10-CM | POA: Diagnosis not present

## 2024-06-05 DIAGNOSIS — Z79899 Other long term (current) drug therapy: Secondary | ICD-10-CM | POA: Diagnosis not present

## 2024-06-05 LAB — RAD ONC ARIA SESSION SUMMARY
Course Elapsed Days: 2
Plan Fractions Treated to Date: 3
Plan Prescribed Dose Per Fraction: 2.25 Gy
Plan Total Fractions Prescribed: 28
Plan Total Prescribed Dose: 63 Gy
Reference Point Dosage Given to Date: 6.75 Gy
Reference Point Session Dosage Given: 2.25 Gy
Session Number: 3

## 2024-06-06 ENCOUNTER — Encounter: Admitting: Dietician

## 2024-06-06 ENCOUNTER — Other Ambulatory Visit: Payer: Self-pay

## 2024-06-06 ENCOUNTER — Ambulatory Visit
Admission: RE | Admit: 2024-06-06 | Discharge: 2024-06-06 | Disposition: A | Discharge status: Home / Self Care | Source: Ambulatory Visit | Attending: Radiation Oncology | Admitting: Radiation Oncology

## 2024-06-06 DIAGNOSIS — Z510 Encounter for antineoplastic radiation therapy: Secondary | ICD-10-CM | POA: Diagnosis not present

## 2024-06-06 DIAGNOSIS — Z79899 Other long term (current) drug therapy: Secondary | ICD-10-CM | POA: Diagnosis not present

## 2024-06-06 DIAGNOSIS — C320 Malignant neoplasm of glottis: Secondary | ICD-10-CM | POA: Diagnosis not present

## 2024-06-06 LAB — RAD ONC ARIA SESSION SUMMARY
Course Elapsed Days: 3
Plan Fractions Treated to Date: 4
Plan Prescribed Dose Per Fraction: 2.25 Gy
Plan Total Fractions Prescribed: 28
Plan Total Prescribed Dose: 63 Gy
Reference Point Dosage Given to Date: 9 Gy
Reference Point Session Dosage Given: 2.25 Gy
Session Number: 4

## 2024-06-07 ENCOUNTER — Ambulatory Visit
Admission: RE | Admit: 2024-06-07 | Discharge: 2024-06-07 | Disposition: A | Discharge status: Home / Self Care | Source: Ambulatory Visit | Attending: Radiation Oncology | Admitting: Radiation Oncology

## 2024-06-07 ENCOUNTER — Other Ambulatory Visit: Payer: Self-pay

## 2024-06-07 DIAGNOSIS — Z79899 Other long term (current) drug therapy: Secondary | ICD-10-CM | POA: Diagnosis not present

## 2024-06-07 DIAGNOSIS — C320 Malignant neoplasm of glottis: Secondary | ICD-10-CM | POA: Diagnosis not present

## 2024-06-07 DIAGNOSIS — Z510 Encounter for antineoplastic radiation therapy: Secondary | ICD-10-CM | POA: Diagnosis not present

## 2024-06-07 LAB — RAD ONC ARIA SESSION SUMMARY
Course Elapsed Days: 4
Plan Fractions Treated to Date: 5
Plan Prescribed Dose Per Fraction: 2.25 Gy
Plan Total Fractions Prescribed: 28
Plan Total Prescribed Dose: 63 Gy
Reference Point Dosage Given to Date: 11.25 Gy
Reference Point Session Dosage Given: 2.25 Gy
Session Number: 5

## 2024-06-10 ENCOUNTER — Other Ambulatory Visit: Payer: Self-pay

## 2024-06-10 ENCOUNTER — Ambulatory Visit
Admission: RE | Admit: 2024-06-10 | Discharge: 2024-06-10 | Disposition: A | Discharge status: Home / Self Care | Source: Ambulatory Visit | Attending: Radiation Oncology | Admitting: Radiation Oncology

## 2024-06-10 DIAGNOSIS — Z79899 Other long term (current) drug therapy: Secondary | ICD-10-CM | POA: Diagnosis not present

## 2024-06-10 DIAGNOSIS — C320 Malignant neoplasm of glottis: Secondary | ICD-10-CM | POA: Diagnosis not present

## 2024-06-10 DIAGNOSIS — Z510 Encounter for antineoplastic radiation therapy: Secondary | ICD-10-CM | POA: Diagnosis not present

## 2024-06-10 LAB — RAD ONC ARIA SESSION SUMMARY
Course Elapsed Days: 7
Plan Fractions Treated to Date: 6
Plan Prescribed Dose Per Fraction: 2.25 Gy
Plan Total Fractions Prescribed: 28
Plan Total Prescribed Dose: 63 Gy
Reference Point Dosage Given to Date: 13.5 Gy
Reference Point Session Dosage Given: 2.25 Gy
Session Number: 6

## 2024-06-11 ENCOUNTER — Other Ambulatory Visit: Payer: Self-pay

## 2024-06-11 ENCOUNTER — Ambulatory Visit
Admission: RE | Admit: 2024-06-11 | Discharge: 2024-06-11 | Disposition: A | Discharge status: Home / Self Care | Source: Ambulatory Visit | Attending: Radiation Oncology | Admitting: Radiation Oncology

## 2024-06-11 DIAGNOSIS — Z79899 Other long term (current) drug therapy: Secondary | ICD-10-CM | POA: Diagnosis not present

## 2024-06-11 DIAGNOSIS — C320 Malignant neoplasm of glottis: Secondary | ICD-10-CM | POA: Diagnosis not present

## 2024-06-11 DIAGNOSIS — Z510 Encounter for antineoplastic radiation therapy: Secondary | ICD-10-CM | POA: Diagnosis not present

## 2024-06-11 LAB — RAD ONC ARIA SESSION SUMMARY
Course Elapsed Days: 8
Plan Fractions Treated to Date: 7
Plan Prescribed Dose Per Fraction: 2.25 Gy
Plan Total Fractions Prescribed: 28
Plan Total Prescribed Dose: 63 Gy
Reference Point Dosage Given to Date: 15.75 Gy
Reference Point Session Dosage Given: 2.25 Gy
Session Number: 7

## 2024-06-12 ENCOUNTER — Ambulatory Visit
Admission: RE | Admit: 2024-06-12 | Discharge: 2024-06-12 | Disposition: A | Discharge status: Home / Self Care | Source: Ambulatory Visit | Attending: Radiation Oncology

## 2024-06-12 ENCOUNTER — Other Ambulatory Visit: Payer: Self-pay

## 2024-06-12 DIAGNOSIS — Z79899 Other long term (current) drug therapy: Secondary | ICD-10-CM | POA: Diagnosis not present

## 2024-06-12 DIAGNOSIS — Z510 Encounter for antineoplastic radiation therapy: Secondary | ICD-10-CM | POA: Diagnosis not present

## 2024-06-12 DIAGNOSIS — C320 Malignant neoplasm of glottis: Secondary | ICD-10-CM | POA: Diagnosis not present

## 2024-06-12 LAB — RAD ONC ARIA SESSION SUMMARY
Course Elapsed Days: 9
Plan Fractions Treated to Date: 8
Plan Prescribed Dose Per Fraction: 2.25 Gy
Plan Total Fractions Prescribed: 28
Plan Total Prescribed Dose: 63 Gy
Reference Point Dosage Given to Date: 18 Gy
Reference Point Session Dosage Given: 2.25 Gy
Session Number: 8

## 2024-06-12 NOTE — Therapy (Signed)
 OUTPATIENT SPEECH LANGUAGE PATHOLOGY ONCOLOGY EVALUATION   Patient Name: Cameron Campbell MRN: [id number] DOB:08/19/1956, 68 y.o., male Today's Date: 06/13/2024  PCP: Elliot SAUNDERS., MD REFERRING PROVIDER: Izell Domino, MD  END OF SESSION:  End of Session - 06/13/24 1056     Visit Number 1    Number of Visits 3    Date for SLP Re-Evaluation 09/11/24    SLP Start Time 0810    SLP Stop Time  0846    SLP Time Calculation (min) 36 min    Activity Tolerance Patient tolerated treatment well          Past Medical History:  Diagnosis Date   Abnormal prostate biopsy    negative bx 2003   Achilles tendinitis    BPPV (benign paroxysmal positional vertigo) 10/2007   Cancer (HCC)    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   Elevated LFTs    Genital herpes    GERD (gastroesophageal reflux disease)    HLD (hyperlipidemia)    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   Lumbar radiculopathy    Onychomycosis    Past Surgical History:  Procedure Laterality Date   APPENDECTOMY     COLONOSCOPY WITH PROPOFOL  N/A 11/15/2016   Procedure: COLONOSCOPY WITH PROPOFOL ;  Surgeon: Gladis MARLA Louder, MD;  Location: [facility];  Service: Endoscopy;  Laterality: N/A;   MICROLARYNGOSCOPY Right 06/05/2023   Procedure: Microlaryngoscopy with Excision of Vocal Cord Lesion;  Surgeon: Jesus Oliphant, MD;  Location: [facility];  Service: ENT;  Laterality: Right;   MICROLARYNGOSCOPY WITH LASER N/A 05/15/2024   Procedure: MICROLARYNGOSCOPY WITH BIOPSY;  Surgeon: Jesus Oliphant, MD;  Location: [facility] OR;  Service: ENT;  Laterality: N/A;   Patient Active Problem List   Diagnosis Date Noted   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  Pt accompanied by: significant other  PERTINENT HISTORY:  Squamous cell carcinoma of the Larynx, stage I (T1a N0 M0). He presented in July 2024 for hoarseness. He was found to have a right cord lesion. On 06/05/23 He underwent a microlaryngoscopy with biopsy of vocal cord lesion by Dr. Jesus. Surgical pathology at that time revealed squamous mucosa with moderate dysplasia in a background of chronic inflammation. He again presented to Dr. Jesus on 04/16/24 with a one month history of voice hoarseness. Laryngoscopy revealed a white plaque like lesion involving the right mid vocal cord. He was treated with aggressive antireflux treatment without relief. 05/15/24 he underwent a microlaryngoscopy and excisional biopsy of the right vocal cord under the care of Dr. Jesus. The lesion was pale, papillary and extended from approximately 30% anterior to the posterior cord, extending until approximately 1 or 2 mm posterior to the anterior commissure, superiorly to the superior mucosal surface of the cord but not extending into the ventricle, and inferiorly to just below the contacting surface of the cord. Surgical pathology indicated well to moderately differentiated invasive squamous cell carcinoma.  05/21/24 Consult with Dr. Izell. He will receive radiation only. Treatment plan: He will receive 28 fractions of radiation to his glottis. Which started on 06/03/24 and complete 07/11/24.  PAIN:  Are you having pain? Yes: NPRS scale: 1/10 Pain location: lt shoulder Pain description: ache, sore  FALLS: Has patient fallen in last 6 months?  No  LIVING ENVIRONMENT: Lives with: lives with their family Lives in: House/apartment  PLOF:  Level of assistance: Independent with ADLs, Independent with IADLs Employment: Full-time employment  PATIENT GOALS: Maintain WNL swallowing  OBJECTIVE:  Note: Objective measures were completed at Evaluation unless otherwise noted.  COGNITION: Overall cognitive  status: Within functional limits for tasks assessed  LANGUAGE: Receptive and Expressive language appeared WNL.  ORAL MOTOR EXAMINATION: Overall status: WFL   MOTOR SPEECH: Overall motor speech: impaired Level of impairment: Word, Phrase, Sentence, and Conversation Respiration: thoracic breathing and diaphragmatic/abdominal breathing Phonation: hoarse - mild Resonance: WFL Articulation: Appears intact Intelligibility: Intelligible  SUBJECTIVE DYSPHAGIA REPORTS:  Date of onset: none reported Reported symptoms: none reported  Current diet: regular and thin liquids  Co-morbid voice changes: Yes  FACTORS WHICH MAY INCREASE RISK OF ADVERSE EVENT IN PRESENCE OF ASPIRATION:  General health: well appearing  Risk factors: none evident   CLINICAL SWALLOW ASSESSMENT:   Dentition: adequate natural dentition Vocal quality at baseline: hoarse Patient directly observed with POs: Yes: dysphagia 3 (soft) and thin liquids  Feeding: able to feed self Liquids provided by: cup Oral phase signs and symptoms: none noted today Pharyngeal phase signs and symptoms: none noted today                                                                                                                            TREATMENT DATE:   06/13/24: Research states the risk for dysphagia increases due to radiation and/or chemotherapy treatment due to a variety of factors, so SLP educated the pt about the possibility of reduced/limited ability for PO intake during rad tx. SLP also educated pt regarding possible changes to swallowing musculature after rad tx, and why adherence to dysphagia HEP provided today and PO consumption was necessary to inhibit muscle fibrosis following rad tx. SLP informed pt why this would be detrimental to their swallowing status and to their pulmonary health. Pt demonstrated understanding of these things to SLP. SLP encouraged pt to safely eat and drink as deep into their radiation/chemotherapy as  possible to provide the best possible long-term swallowing outcome for pt. SLP then developed an individualized HEP for pt involving oral and pharyngeal and vocal  strengthening and ROM and pt was instructed how to perform these exercises, including SLP demonstration. After SLP demonstration, pt return demonstrated each exercise. SLP ensured pt performance was correct prior to educating pt on next exercise. Pt required usual mod cues faded to modified independent to perform HEP. Pt was instructed to complete this program 5-7 days/week, at least 20 reps a day until 6 months after his last day of rad tx, and then x2 a week after that, indefinitely.   PATIENT EDUCATION: Education details: late effects head/neck radiation on swallow function and HEP procedure Person educated: Patient and Spouse Education method: Explanation, Demonstration, Verbal cues, and Handouts Education comprehension: verbalized understanding, returned demonstration, verbal cues  required, and needs further education   ASSESSMENT:  CLINICAL IMPRESSION: Patient is a 67 y.o. M who was seen today for assessment of swallowing as they undergo radiation/chemoradiation therapy. Today pt ate  [place] sandwich and drank thin liquids without overt s/s oral or pharyngeal difficulty. At this time pt swallowing is deemed WNL/WFL with these POs. No oral or overt s/sx pharyngeal deficits, including aspiration were observed. There are no overt s/s aspiration PNA observed by SLP nor any reported by pt at this time. Data indicate that pt's swallow ability will likely decrease over the course of radiation/chemoradiation therapy and could very well decline over time following the conclusion of that therapy due to muscle disuse atrophy and/or muscle fibrosis. Pt will cont to need to be seen by SLP in order to assess safety of PO intake, assess the need for recommending any objective swallow assessment, and ensuring pt is correctly completing the  individualized HEP.  OBJECTIVE IMPAIRMENTS: include dysphagia and hoarseness. These impairments are limiting patient from household responsibilities, ADLs/IADLs, effectively communicating at home and in community, and safety when swallowing. Factors affecting potential to achieve goals and functional outcome are none noted today. Patient will benefit from skilled SLP services to address above impairments and improve overall function.  REHAB POTENTIAL: Good   GOALS: Goals reviewed with patient? No SHORT TERM GOALS: Target: 3rd total session   1. Pt will compelte HEP with modified independence in 2 sessions Baseline: Goal status: INITIAL   2.  pt will tell SLP why pt is completing HEP with modified independence Baseline:  Goal status: INITIAL   3.  pt will describe 3 overt s/s aspiration PNA with modified independence Baseline:  Goal status: INITIAL   4.  pt will tell SLP how a food journal could hasten return to a more normalized diet Baseline:  Goal status: INITIAL     LONG TERM GOALS: Target: 7th total session   1.  pt will complete HEP with independence over two visits Baseline:  Goal status: INITIAL   2.  pt will describe how to modify HEP over time, and the timeline associated with reduction in HEP frequency with modified independence over two sessions Baseline:  Goal status: INITIAL     PLAN:   SLP FREQUENCY:  once approx every 4 weeks   SLP DURATION:  7 sessions   PLANNED INTERVENTIONS: Aspiration precaution training, Pharyngeal strengthening exercises, Diet toleration management , Trials of upgraded texture/liquids, SLP instruction and feedback, Compensatory strategies, and Patient/family education    [facility], CCC-SLP 06/13/2024, 11:02 AM

## 2024-06-13 ENCOUNTER — Inpatient Hospital Stay: Admitting: Nutrition

## 2024-06-13 ENCOUNTER — Other Ambulatory Visit: Payer: Self-pay

## 2024-06-13 ENCOUNTER — Ambulatory Visit
Admission: RE | Admit: 2024-06-13 | Discharge: 2024-06-13 | Disposition: A | Discharge status: Home / Self Care | Source: Ambulatory Visit | Attending: Radiation Oncology | Admitting: Radiation Oncology

## 2024-06-13 ENCOUNTER — Ambulatory Visit: Attending: Radiation Oncology

## 2024-06-13 DIAGNOSIS — R1310 Dysphagia, unspecified: Secondary | ICD-10-CM | POA: Diagnosis not present

## 2024-06-13 DIAGNOSIS — C320 Malignant neoplasm of glottis: Secondary | ICD-10-CM | POA: Insufficient documentation

## 2024-06-13 DIAGNOSIS — R490 Dysphonia: Secondary | ICD-10-CM | POA: Diagnosis not present

## 2024-06-13 DIAGNOSIS — Z510 Encounter for antineoplastic radiation therapy: Secondary | ICD-10-CM | POA: Diagnosis not present

## 2024-06-13 DIAGNOSIS — Z79899 Other long term (current) drug therapy: Secondary | ICD-10-CM | POA: Diagnosis not present

## 2024-06-13 LAB — RAD ONC ARIA SESSION SUMMARY
Course Elapsed Days: 10
Plan Fractions Treated to Date: 9
Plan Prescribed Dose Per Fraction: 2.25 Gy
Plan Total Fractions Prescribed: 28
Plan Total Prescribed Dose: 63 Gy
Reference Point Dosage Given to Date: 20.25 Gy
Reference Point Session Dosage Given: 2.25 Gy
Session Number: 9

## 2024-06-13 NOTE — Progress Notes (Signed)
 Oncology Nurse Navigator Documentation   Mr. Klem presented to head and neck [facility] today for consult with SLP. He is tolerating treatment well at this time and knows to call me if he has any questions or concerns as he proceeds through treatment.   [name] RN, BSN, OCN Head & Neck Oncology Nurse Navigator [facility] Phone # [phone]  Fax # [phone]

## 2024-06-13 NOTE — Patient Instructions (Signed)
 SWALLOWING EXERCISES Do these 5-6 days/week until 6 months after your last day of radiation, then 2 days per week afterwards You can use 1-2 drops of liquid to help you swallow, if your mouth gets dry  Effortful Swallows - Press your tongue against the roof of your mouth for 3 seconds, then swallow as hard as you can - Do at least 20 reps/day, in sets of 5-10  [place]ako Swallow - swallow with your tongue sticking out - Stick tongue out past your lips and gently bite tongue with your teeth - Swallow, while holding your tongue with your teeth - Do at least 20 reps/day, in sets of 5-10   Siren exercise - stretch your vocal cords by saying oooooooo at a low pitch and then glide up to as high of a pitch as you can- at least 20 times a day

## 2024-06-13 NOTE — Progress Notes (Signed)
 Nutrition follow-up completed over the telephone with patient.  He is receiving radiation therapy for glottis cancer and is followed by Dr. Izell.  Final radiation therapy is scheduled for September 18.  Weight improved and documented as 178.8 pounds on August 18.  This is improved from 176 pounds August 12.  He feels well.  His appetite is great.  He is not having any nutrition impact symptoms today.  Reports consuming 4-16 ounce bottles of water every day.  No questions or concerns.  Nutrition diagnosis: Predicted sub-optimal energy intake continues.  Intervention: Encourage patient to increase fluids to 64 to 80 ounces daily. Recommended using baking soda and salt water gargles frequently to help with thickened saliva and mouth care.  Monitoring, evaluation, goals: Patient will tolerate adequate calories and protein to minimize weight loss.  Next visit: Thursday, August 28 before radiation treatment.

## 2024-06-14 ENCOUNTER — Ambulatory Visit
Admission: RE | Admit: 2024-06-14 | Discharge: 2024-06-14 | Disposition: A | Discharge status: Home / Self Care | Source: Ambulatory Visit | Attending: Radiation Oncology | Admitting: Radiation Oncology

## 2024-06-14 ENCOUNTER — Other Ambulatory Visit: Payer: Self-pay

## 2024-06-14 DIAGNOSIS — Z510 Encounter for antineoplastic radiation therapy: Secondary | ICD-10-CM | POA: Diagnosis not present

## 2024-06-14 DIAGNOSIS — C320 Malignant neoplasm of glottis: Secondary | ICD-10-CM | POA: Diagnosis not present

## 2024-06-14 DIAGNOSIS — Z79899 Other long term (current) drug therapy: Secondary | ICD-10-CM | POA: Diagnosis not present

## 2024-06-14 LAB — RAD ONC ARIA SESSION SUMMARY
Course Elapsed Days: 11
Plan Fractions Treated to Date: 10
Plan Prescribed Dose Per Fraction: 2.25 Gy
Plan Total Fractions Prescribed: 28
Plan Total Prescribed Dose: 63 Gy
Reference Point Dosage Given to Date: 22.5 Gy
Reference Point Session Dosage Given: 2.25 Gy
Session Number: 10

## 2024-06-17 ENCOUNTER — Ambulatory Visit
Admission: RE | Admit: 2024-06-17 | Discharge: 2024-06-17 | Disposition: A | Discharge status: Home / Self Care | Source: Ambulatory Visit | Attending: Radiation Oncology | Admitting: Radiation Oncology

## 2024-06-17 ENCOUNTER — Other Ambulatory Visit: Payer: Self-pay

## 2024-06-17 DIAGNOSIS — Z79899 Other long term (current) drug therapy: Secondary | ICD-10-CM | POA: Diagnosis not present

## 2024-06-17 DIAGNOSIS — C320 Malignant neoplasm of glottis: Secondary | ICD-10-CM | POA: Diagnosis not present

## 2024-06-17 DIAGNOSIS — Z510 Encounter for antineoplastic radiation therapy: Secondary | ICD-10-CM | POA: Diagnosis not present

## 2024-06-17 LAB — RAD ONC ARIA SESSION SUMMARY
Course Elapsed Days: 14
Plan Fractions Treated to Date: 11
Plan Prescribed Dose Per Fraction: 2.25 Gy
Plan Total Fractions Prescribed: 28
Plan Total Prescribed Dose: 63 Gy
Reference Point Dosage Given to Date: 24.75 Gy
Reference Point Session Dosage Given: 2.25 Gy
Session Number: 11

## 2024-06-18 ENCOUNTER — Other Ambulatory Visit: Payer: Self-pay

## 2024-06-18 ENCOUNTER — Ambulatory Visit
Admission: RE | Admit: 2024-06-18 | Discharge: 2024-06-18 | Disposition: A | Discharge status: Home / Self Care | Source: Ambulatory Visit | Attending: Radiation Oncology | Admitting: Radiation Oncology

## 2024-06-18 DIAGNOSIS — Z79899 Other long term (current) drug therapy: Secondary | ICD-10-CM | POA: Diagnosis not present

## 2024-06-18 DIAGNOSIS — C320 Malignant neoplasm of glottis: Secondary | ICD-10-CM | POA: Diagnosis not present

## 2024-06-18 DIAGNOSIS — Z510 Encounter for antineoplastic radiation therapy: Secondary | ICD-10-CM | POA: Diagnosis not present

## 2024-06-18 LAB — RAD ONC ARIA SESSION SUMMARY
Course Elapsed Days: 15
Plan Fractions Treated to Date: 12
Plan Prescribed Dose Per Fraction: 2.25 Gy
Plan Total Fractions Prescribed: 28
Plan Total Prescribed Dose: 63 Gy
Reference Point Dosage Given to Date: 27 Gy
Reference Point Session Dosage Given: 2.25 Gy
Session Number: 12

## 2024-06-19 ENCOUNTER — Other Ambulatory Visit: Payer: Self-pay

## 2024-06-19 ENCOUNTER — Ambulatory Visit
Admission: RE | Admit: 2024-06-19 | Discharge: 2024-06-19 | Disposition: A | Discharge status: Home / Self Care | Source: Ambulatory Visit | Attending: Radiation Oncology

## 2024-06-19 DIAGNOSIS — Z510 Encounter for antineoplastic radiation therapy: Secondary | ICD-10-CM | POA: Diagnosis not present

## 2024-06-19 DIAGNOSIS — C320 Malignant neoplasm of glottis: Secondary | ICD-10-CM | POA: Diagnosis not present

## 2024-06-19 DIAGNOSIS — Z79899 Other long term (current) drug therapy: Secondary | ICD-10-CM | POA: Diagnosis not present

## 2024-06-19 LAB — RAD ONC ARIA SESSION SUMMARY
Course Elapsed Days: 16
Plan Fractions Treated to Date: 13
Plan Prescribed Dose Per Fraction: 2.25 Gy
Plan Total Fractions Prescribed: 28
Plan Total Prescribed Dose: 63 Gy
Reference Point Dosage Given to Date: 29.25 Gy
Reference Point Session Dosage Given: 2.25 Gy
Session Number: 13

## 2024-06-20 ENCOUNTER — Inpatient Hospital Stay: Admitting: Nutrition

## 2024-06-20 ENCOUNTER — Ambulatory Visit
Admission: RE | Admit: 2024-06-20 | Discharge: 2024-06-20 | Disposition: A | Discharge status: Home / Self Care | Source: Ambulatory Visit | Attending: Radiation Oncology

## 2024-06-20 ENCOUNTER — Other Ambulatory Visit: Payer: Self-pay

## 2024-06-20 DIAGNOSIS — Z510 Encounter for antineoplastic radiation therapy: Secondary | ICD-10-CM | POA: Diagnosis not present

## 2024-06-20 DIAGNOSIS — C320 Malignant neoplasm of glottis: Secondary | ICD-10-CM | POA: Diagnosis not present

## 2024-06-20 DIAGNOSIS — Z79899 Other long term (current) drug therapy: Secondary | ICD-10-CM | POA: Diagnosis not present

## 2024-06-20 LAB — RAD ONC ARIA SESSION SUMMARY
Course Elapsed Days: 17
Plan Fractions Treated to Date: 14
Plan Prescribed Dose Per Fraction: 2.25 Gy
Plan Total Fractions Prescribed: 28
Plan Total Prescribed Dose: 63 Gy
Reference Point Dosage Given to Date: 31.5 Gy
Reference Point Session Dosage Given: 2.25 Gy
Session Number: 14

## 2024-06-20 NOTE — Progress Notes (Signed)
 Nutrition follow-up completed with patient over the telephone.  He receives radiation therapy for glottis cancer and is followed by Dr. Izell.  Final radiation therapy scheduled for September 18.  Weight is stable and documented as 178 pounds on August 25.  No recent labs.  Nutrition diagnosis: Predicted sub-optimal energy intake continues.  Reports voice is getting hoarse. He has started to take smaller bites because it feels like I am swallowing a golf ball'. He has not started baking soda and salt water gargles but agrees he better start today. He is drinking more water. No questions or concerns.  Intervention: Enforced importance of using baking soda and salt water gargles. Recommended patient consume between 64 and 80 ounces of water daily. Continue strategies for adequate calories and protein in small amounts throughout the day.  Monitoring, evaluation, goals: Patient will continue to tolerate adequate calories and protein to minimize weight loss.  Next visit: Thursday, September 4 on the telephone with Cameron Campbell.

## 2024-06-21 ENCOUNTER — Other Ambulatory Visit: Payer: Self-pay

## 2024-06-21 ENCOUNTER — Ambulatory Visit
Admission: RE | Admit: 2024-06-21 | Discharge: 2024-06-21 | Disposition: A | Discharge status: Home / Self Care | Source: Ambulatory Visit | Attending: Radiation Oncology | Admitting: Radiation Oncology

## 2024-06-21 DIAGNOSIS — Z510 Encounter for antineoplastic radiation therapy: Secondary | ICD-10-CM | POA: Diagnosis not present

## 2024-06-21 DIAGNOSIS — C320 Malignant neoplasm of glottis: Secondary | ICD-10-CM | POA: Diagnosis not present

## 2024-06-21 DIAGNOSIS — Z79899 Other long term (current) drug therapy: Secondary | ICD-10-CM | POA: Diagnosis not present

## 2024-06-21 LAB — RAD ONC ARIA SESSION SUMMARY
Course Elapsed Days: 18
Plan Fractions Treated to Date: 15
Plan Prescribed Dose Per Fraction: 2.25 Gy
Plan Total Fractions Prescribed: 28
Plan Total Prescribed Dose: 63 Gy
Reference Point Dosage Given to Date: 33.75 Gy
Reference Point Session Dosage Given: 2.25 Gy
Session Number: 15

## 2024-06-25 ENCOUNTER — Other Ambulatory Visit: Payer: Self-pay

## 2024-06-25 ENCOUNTER — Ambulatory Visit
Admission: RE | Admit: 2024-06-25 | Discharge: 2024-06-25 | Disposition: A | Discharge status: Home / Self Care | Source: Ambulatory Visit | Attending: Radiation Oncology | Admitting: Radiation Oncology

## 2024-06-25 ENCOUNTER — Ambulatory Visit
Admission: RE | Admit: 2024-06-25 | Discharge: 2024-06-25 | Disposition: A | Discharge status: Home / Self Care | Source: Ambulatory Visit | Attending: Radiation Oncology

## 2024-06-25 DIAGNOSIS — C320 Malignant neoplasm of glottis: Secondary | ICD-10-CM | POA: Insufficient documentation

## 2024-06-25 DIAGNOSIS — Z510 Encounter for antineoplastic radiation therapy: Secondary | ICD-10-CM | POA: Insufficient documentation

## 2024-06-25 LAB — RAD ONC ARIA SESSION SUMMARY
Course Elapsed Days: 22
Plan Fractions Treated to Date: 16
Plan Prescribed Dose Per Fraction: 2.25 Gy
Plan Total Fractions Prescribed: 28
Plan Total Prescribed Dose: 63 Gy
Reference Point Dosage Given to Date: 36 Gy
Reference Point Session Dosage Given: 2.25 Gy
Session Number: 16

## 2024-06-26 ENCOUNTER — Ambulatory Visit
Admission: RE | Admit: 2024-06-26 | Discharge: 2024-06-26 | Disposition: A | Discharge status: Home / Self Care | Source: Ambulatory Visit | Attending: Radiation Oncology | Admitting: Radiation Oncology

## 2024-06-26 ENCOUNTER — Other Ambulatory Visit: Payer: Self-pay

## 2024-06-26 DIAGNOSIS — C320 Malignant neoplasm of glottis: Secondary | ICD-10-CM | POA: Diagnosis not present

## 2024-06-26 DIAGNOSIS — Z510 Encounter for antineoplastic radiation therapy: Secondary | ICD-10-CM | POA: Diagnosis not present

## 2024-06-26 LAB — RAD ONC ARIA SESSION SUMMARY
Course Elapsed Days: 23
Plan Fractions Treated to Date: 17
Plan Prescribed Dose Per Fraction: 2.25 Gy
Plan Total Fractions Prescribed: 28
Plan Total Prescribed Dose: 63 Gy
Reference Point Dosage Given to Date: 38.25 Gy
Reference Point Session Dosage Given: 2.25 Gy
Session Number: 17

## 2024-06-27 ENCOUNTER — Ambulatory Visit
Admission: RE | Admit: 2024-06-27 | Discharge: 2024-06-27 | Disposition: A | Discharge status: Home / Self Care | Source: Ambulatory Visit | Attending: Radiation Oncology

## 2024-06-27 ENCOUNTER — Inpatient Hospital Stay: Attending: Radiation Oncology | Admitting: Dietician

## 2024-06-27 ENCOUNTER — Other Ambulatory Visit: Payer: Self-pay

## 2024-06-27 ENCOUNTER — Telehealth: Payer: Self-pay | Admitting: Dietician

## 2024-06-27 DIAGNOSIS — C320 Malignant neoplasm of glottis: Secondary | ICD-10-CM | POA: Diagnosis not present

## 2024-06-27 DIAGNOSIS — Z510 Encounter for antineoplastic radiation therapy: Secondary | ICD-10-CM | POA: Diagnosis not present

## 2024-06-27 LAB — RAD ONC ARIA SESSION SUMMARY
Course Elapsed Days: 24
Plan Fractions Treated to Date: 18
Plan Prescribed Dose Per Fraction: 2.25 Gy
Plan Total Fractions Prescribed: 28
Plan Total Prescribed Dose: 63 Gy
Reference Point Dosage Given to Date: 40.5 Gy
Reference Point Session Dosage Given: 2.25 Gy
Session Number: 18

## 2024-06-27 NOTE — Telephone Encounter (Signed)
 Nutrition Follow-up:  Patient with glottis cancer. He is undergoing radiation. Patient followed by Dr. Izell Sermon with patient via telephone. Patient says he does not have much of a voice left. Hoarseness appreciated. Throat is becoming sore, but not enough to start lidocaine . Patient felt as if he swallowed a ping pong from Friday until Monday, but this has subsided. He is doing baking soda salt water gargles twice daily.   Patient says appetite is normal. Eating 3 meals. Recalls salmon, rice, vegetables for dinner, fish tacos at lunch, and cereal with oat milk for breakfast. He has not tried Boost/Ensure. Patient says this is fake nutrition. He would rather have chocolate peanut butter/banana shake instead. He is drinking ~40 ounces of water.    Medications: reviewed   Labs: no new labs  Anthropometrics: Wt 175.2 lb on 9/2 decreased (pt reports not removing wallet from pocket on 8/25)  8/25 - 178 lb    NUTRITION DIAGNOSIS: Predicted sub-optimal intake - ongoing    INTERVENTION:  Encourage high calorie high protein foods - discussed strategies for adding calories/protein to foods (adding cheese/butter/gravy/sauces) Recommend daily shake for wt maintenance Continue baking soda salt water gargles    MONITORING, EVALUATION, GOAL: wt trends, intake    NEXT VISIT: Thursday September 11

## 2024-06-28 ENCOUNTER — Other Ambulatory Visit: Payer: Self-pay

## 2024-06-28 ENCOUNTER — Ambulatory Visit
Admission: RE | Admit: 2024-06-28 | Discharge: 2024-06-28 | Disposition: A | Discharge status: Home / Self Care | Source: Ambulatory Visit | Attending: Radiation Oncology | Admitting: Radiation Oncology

## 2024-06-28 DIAGNOSIS — Z510 Encounter for antineoplastic radiation therapy: Secondary | ICD-10-CM | POA: Diagnosis not present

## 2024-06-28 DIAGNOSIS — C320 Malignant neoplasm of glottis: Secondary | ICD-10-CM | POA: Diagnosis not present

## 2024-06-28 LAB — RAD ONC ARIA SESSION SUMMARY
Course Elapsed Days: 25
Plan Fractions Treated to Date: 19
Plan Prescribed Dose Per Fraction: 2.25 Gy
Plan Total Fractions Prescribed: 28
Plan Total Prescribed Dose: 63 Gy
Reference Point Dosage Given to Date: 42.75 Gy
Reference Point Session Dosage Given: 2.25 Gy
Session Number: 19

## 2024-07-01 ENCOUNTER — Other Ambulatory Visit: Payer: Self-pay

## 2024-07-01 ENCOUNTER — Ambulatory Visit
Admission: RE | Admit: 2024-07-01 | Discharge: 2024-07-01 | Disposition: A | Discharge status: Home / Self Care | Source: Ambulatory Visit | Attending: Radiation Oncology | Admitting: Radiation Oncology

## 2024-07-01 ENCOUNTER — Ambulatory Visit
Admission: RE | Admit: 2024-07-01 | Discharge: 2024-07-01 | Disposition: A | Discharge status: Home / Self Care | Source: Ambulatory Visit | Attending: Radiation Oncology

## 2024-07-01 DIAGNOSIS — Z510 Encounter for antineoplastic radiation therapy: Secondary | ICD-10-CM | POA: Diagnosis not present

## 2024-07-01 DIAGNOSIS — C320 Malignant neoplasm of glottis: Secondary | ICD-10-CM | POA: Diagnosis not present

## 2024-07-01 LAB — RAD ONC ARIA SESSION SUMMARY
Course Elapsed Days: 28
Plan Fractions Treated to Date: 20
Plan Prescribed Dose Per Fraction: 2.25 Gy
Plan Total Fractions Prescribed: 28
Plan Total Prescribed Dose: 63 Gy
Reference Point Dosage Given to Date: 45 Gy
Reference Point Session Dosage Given: 2.25 Gy
Session Number: 20

## 2024-07-02 ENCOUNTER — Ambulatory Visit
Admission: RE | Admit: 2024-07-02 | Discharge: 2024-07-02 | Disposition: A | Discharge status: Home / Self Care | Source: Ambulatory Visit | Attending: Radiation Oncology

## 2024-07-02 ENCOUNTER — Other Ambulatory Visit: Payer: Self-pay

## 2024-07-02 DIAGNOSIS — C320 Malignant neoplasm of glottis: Secondary | ICD-10-CM | POA: Diagnosis not present

## 2024-07-02 DIAGNOSIS — Z510 Encounter for antineoplastic radiation therapy: Secondary | ICD-10-CM | POA: Diagnosis not present

## 2024-07-02 LAB — RAD ONC ARIA SESSION SUMMARY
Course Elapsed Days: 29
Plan Fractions Treated to Date: 21
Plan Prescribed Dose Per Fraction: 2.25 Gy
Plan Total Fractions Prescribed: 28
Plan Total Prescribed Dose: 63 Gy
Reference Point Dosage Given to Date: 47.25 Gy
Reference Point Session Dosage Given: 2.25 Gy
Session Number: 21

## 2024-07-03 ENCOUNTER — Other Ambulatory Visit: Payer: Self-pay

## 2024-07-03 ENCOUNTER — Ambulatory Visit
Admission: RE | Admit: 2024-07-03 | Discharge: 2024-07-03 | Disposition: A | Discharge status: Home / Self Care | Source: Ambulatory Visit | Attending: Radiation Oncology | Admitting: Radiation Oncology

## 2024-07-03 DIAGNOSIS — Z510 Encounter for antineoplastic radiation therapy: Secondary | ICD-10-CM | POA: Diagnosis not present

## 2024-07-03 DIAGNOSIS — C320 Malignant neoplasm of glottis: Secondary | ICD-10-CM | POA: Diagnosis not present

## 2024-07-03 LAB — RAD ONC ARIA SESSION SUMMARY
Course Elapsed Days: 30
Plan Fractions Treated to Date: 22
Plan Prescribed Dose Per Fraction: 2.25 Gy
Plan Total Fractions Prescribed: 28
Plan Total Prescribed Dose: 63 Gy
Reference Point Dosage Given to Date: 49.5 Gy
Reference Point Session Dosage Given: 2.25 Gy
Session Number: 22

## 2024-07-04 ENCOUNTER — Inpatient Hospital Stay: Admitting: Dietician

## 2024-07-04 ENCOUNTER — Other Ambulatory Visit: Payer: Self-pay

## 2024-07-04 ENCOUNTER — Ambulatory Visit
Admission: RE | Admit: 2024-07-04 | Discharge: 2024-07-04 | Disposition: A | Discharge status: Home / Self Care | Source: Ambulatory Visit | Attending: Radiation Oncology | Admitting: Radiation Oncology

## 2024-07-04 DIAGNOSIS — C320 Malignant neoplasm of glottis: Secondary | ICD-10-CM | POA: Diagnosis not present

## 2024-07-04 DIAGNOSIS — Z510 Encounter for antineoplastic radiation therapy: Secondary | ICD-10-CM | POA: Diagnosis not present

## 2024-07-04 LAB — RAD ONC ARIA SESSION SUMMARY
Course Elapsed Days: 31
Plan Fractions Treated to Date: 23
Plan Prescribed Dose Per Fraction: 2.25 Gy
Plan Total Fractions Prescribed: 28
Plan Total Prescribed Dose: 63 Gy
Reference Point Dosage Given to Date: 51.75 Gy
Reference Point Session Dosage Given: 2.25 Gy
Session Number: 23

## 2024-07-04 NOTE — Progress Notes (Signed)
 Nutrition Follow-up:  Patient with glottis cancer. He is undergoing radiation. Final treatment planned 9/18. Patient followed by Dr. Izell   Met with patient in office. He is doing well overall other than hoarseness. Patient does not like sitting around his buddies and not being able to say anything. Reports some soreness to throat. This is tolerable without use of lidocaine . Patient can feel saliva building up in his throat. He is using baking soda salt water gargles daily.   Appetite is good. Patient tolerating a regular diet with minimal odynophagia. Yesterday had bowl of raisin bran with granola, 3 slices of pepperoni pizza for lunch which gave him indigestion. Notes he forgot to take antiacid pill. Recalls bowl of chicken chili for dinner. This was not too spicy. Patient had one beer. He is drinking ~40 ounces of water. Patient does not care for Ensure/Boost. Prefers a milkshake which he has occasionally.    Medications: reviewed   Labs: no new labs  Anthropometrics: Wt 175.4 lb on 9/8 (aria) - stable  9/2 - 175.2 lb    NUTRITION DIAGNOSIS: Predicted sub-optimal intake - ongoing   INTERVENTION:  Encourage high calorie high protein foods for wt maintenance Continue baking soda salt water gargles - recommend several times daily Encourage milkshake with worsening sore throat     MONITORING, EVALUATION, GOAL: wt trends, intake    NEXT VISIT: Thursday September 18 via telephone with Heron

## 2024-07-05 ENCOUNTER — Other Ambulatory Visit: Payer: Self-pay

## 2024-07-05 ENCOUNTER — Ambulatory Visit
Admission: RE | Admit: 2024-07-05 | Discharge: 2024-07-05 | Disposition: A | Discharge status: Home / Self Care | Source: Ambulatory Visit | Attending: Radiation Oncology

## 2024-07-05 DIAGNOSIS — C320 Malignant neoplasm of glottis: Secondary | ICD-10-CM | POA: Diagnosis not present

## 2024-07-05 DIAGNOSIS — Z510 Encounter for antineoplastic radiation therapy: Secondary | ICD-10-CM | POA: Diagnosis not present

## 2024-07-05 LAB — RAD ONC ARIA SESSION SUMMARY
Course Elapsed Days: 32
Plan Fractions Treated to Date: 24
Plan Prescribed Dose Per Fraction: 2.25 Gy
Plan Total Fractions Prescribed: 28
Plan Total Prescribed Dose: 63 Gy
Reference Point Dosage Given to Date: 54 Gy
Reference Point Session Dosage Given: 2.25 Gy
Session Number: 24

## 2024-07-08 ENCOUNTER — Other Ambulatory Visit: Payer: Self-pay

## 2024-07-08 ENCOUNTER — Ambulatory Visit
Admission: RE | Admit: 2024-07-08 | Discharge: 2024-07-08 | Disposition: A | Discharge status: Home / Self Care | Source: Ambulatory Visit | Attending: Radiation Oncology

## 2024-07-08 ENCOUNTER — Ambulatory Visit

## 2024-07-08 ENCOUNTER — Ambulatory Visit
Admission: RE | Admit: 2024-07-08 | Discharge: 2024-07-08 | Disposition: A | Discharge status: Home / Self Care | Source: Ambulatory Visit | Attending: Radiation Oncology | Admitting: Radiation Oncology

## 2024-07-08 DIAGNOSIS — Z510 Encounter for antineoplastic radiation therapy: Secondary | ICD-10-CM | POA: Diagnosis not present

## 2024-07-08 DIAGNOSIS — C320 Malignant neoplasm of glottis: Secondary | ICD-10-CM | POA: Diagnosis not present

## 2024-07-08 LAB — RAD ONC ARIA SESSION SUMMARY
Course Elapsed Days: 35
Plan Fractions Treated to Date: 25
Plan Prescribed Dose Per Fraction: 2.25 Gy
Plan Total Fractions Prescribed: 28
Plan Total Prescribed Dose: 63 Gy
Reference Point Dosage Given to Date: 56.25 Gy
Reference Point Session Dosage Given: 2.25 Gy
Session Number: 25

## 2024-07-09 ENCOUNTER — Encounter: Admitting: Nutrition

## 2024-07-09 ENCOUNTER — Other Ambulatory Visit: Payer: Self-pay

## 2024-07-09 ENCOUNTER — Ambulatory Visit
Admission: RE | Admit: 2024-07-09 | Discharge: 2024-07-09 | Disposition: A | Discharge status: Home / Self Care | Source: Ambulatory Visit | Attending: Radiation Oncology

## 2024-07-09 DIAGNOSIS — C320 Malignant neoplasm of glottis: Secondary | ICD-10-CM | POA: Diagnosis not present

## 2024-07-09 DIAGNOSIS — Z510 Encounter for antineoplastic radiation therapy: Secondary | ICD-10-CM | POA: Diagnosis not present

## 2024-07-09 LAB — RAD ONC ARIA SESSION SUMMARY
Course Elapsed Days: 36
Plan Fractions Treated to Date: 26
Plan Prescribed Dose Per Fraction: 2.25 Gy
Plan Total Fractions Prescribed: 28
Plan Total Prescribed Dose: 63 Gy
Reference Point Dosage Given to Date: 58.5 Gy
Reference Point Session Dosage Given: 2.25 Gy
Session Number: 26

## 2024-07-10 ENCOUNTER — Ambulatory Visit
Admission: RE | Admit: 2024-07-10 | Discharge: 2024-07-10 | Disposition: A | Discharge status: Home / Self Care | Source: Ambulatory Visit | Attending: Radiation Oncology

## 2024-07-10 ENCOUNTER — Ambulatory Visit

## 2024-07-10 ENCOUNTER — Other Ambulatory Visit: Payer: Self-pay

## 2024-07-10 DIAGNOSIS — Z510 Encounter for antineoplastic radiation therapy: Secondary | ICD-10-CM | POA: Diagnosis not present

## 2024-07-10 DIAGNOSIS — C320 Malignant neoplasm of glottis: Secondary | ICD-10-CM | POA: Diagnosis not present

## 2024-07-10 LAB — RAD ONC ARIA SESSION SUMMARY
Course Elapsed Days: 37
Plan Fractions Treated to Date: 27
Plan Prescribed Dose Per Fraction: 2.25 Gy
Plan Total Fractions Prescribed: 28
Plan Total Prescribed Dose: 63 Gy
Reference Point Dosage Given to Date: 60.75 Gy
Reference Point Session Dosage Given: 2.25 Gy
Session Number: 27

## 2024-07-11 ENCOUNTER — Inpatient Hospital Stay: Admitting: Nutrition

## 2024-07-11 ENCOUNTER — Other Ambulatory Visit: Payer: Self-pay

## 2024-07-11 ENCOUNTER — Ambulatory Visit
Admission: RE | Admit: 2024-07-11 | Discharge: 2024-07-11 | Disposition: A | Discharge status: Home / Self Care | Source: Ambulatory Visit | Attending: Radiation Oncology | Admitting: Radiation Oncology

## 2024-07-11 ENCOUNTER — Ambulatory Visit: Attending: Radiation Oncology

## 2024-07-11 DIAGNOSIS — R1310 Dysphagia, unspecified: Secondary | ICD-10-CM | POA: Diagnosis not present

## 2024-07-11 DIAGNOSIS — R490 Dysphonia: Secondary | ICD-10-CM | POA: Diagnosis not present

## 2024-07-11 DIAGNOSIS — C320 Malignant neoplasm of glottis: Secondary | ICD-10-CM | POA: Diagnosis not present

## 2024-07-11 DIAGNOSIS — Z510 Encounter for antineoplastic radiation therapy: Secondary | ICD-10-CM | POA: Diagnosis not present

## 2024-07-11 LAB — RAD ONC ARIA SESSION SUMMARY
Course Elapsed Days: 38
Plan Fractions Treated to Date: 28
Plan Prescribed Dose Per Fraction: 2.25 Gy
Plan Total Fractions Prescribed: 28
Plan Total Prescribed Dose: 63 Gy
Reference Point Dosage Given to Date: 63 Gy
Reference Point Session Dosage Given: 2.25 Gy
Session Number: 28

## 2024-07-11 NOTE — Progress Notes (Signed)
 Patient completes radiation treatment today for Glottis cancer. He has maintained his weight throughout treatment and weighed 175.2 pounds today. Predicted sub-optimal energy intake resolved. Patient encouraged to call RD with questions or concerns. He has RD contact information.

## 2024-07-11 NOTE — Therapy (Signed)
 OUTPATIENT SPEECH LANGUAGE PATHOLOGY ONCOLOGY EVALUATION   Patient Name: Cameron Campbell MRN: [id number] DOB:[birth year], 68 y.o., male Today's Date: 07/11/2024  PCP: Elliot SAUNDERS., MD REFERRING PROVIDER: Izell Domino, MD  END OF SESSION:  End of Session - 07/11/24 1046     Visit Number 2    Number of Visits 3    Date for Recertification  09/11/24    SLP Start Time 1045    SLP Stop Time  1122    SLP Time Calculation (min) 37 min    Activity Tolerance Patient tolerated treatment well          Past Medical History:  Diagnosis Date   Abnormal prostate biopsy    negative bx 2003   Achilles tendinitis    BPPV (benign paroxysmal positional vertigo) 10/2007   Cancer (HCC)    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   Elevated LFTs    Genital herpes    GERD (gastroesophageal reflux disease)    HLD (hyperlipidemia)    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   Lumbar radiculopathy    Onychomycosis    Past Surgical History:  Procedure Laterality Date   APPENDECTOMY     COLONOSCOPY WITH PROPOFOL  N/A 11/15/2016   Procedure: COLONOSCOPY WITH PROPOFOL ;  Surgeon: Gladis MARLA Louder, MD;  Location: [facility];  Service: Endoscopy;  Laterality: N/A;   MICROLARYNGOSCOPY Right 06/05/2023   Procedure: Microlaryngoscopy with Excision of Vocal Cord Lesion;  Surgeon: Jesus Oliphant, MD;  Location: [facility];  Service: ENT;  Laterality: Right;   MICROLARYNGOSCOPY WITH LASER N/A 05/15/2024   Procedure: MICROLARYNGOSCOPY WITH BIOPSY;  Surgeon: Jesus Oliphant, MD;  Location: [facility] OR;  Service: ENT;  Laterality: N/A;   Patient Active Problem List   Diagnosis Date Noted   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  Pt accompanied by: self  PERTINENT HISTORY:  Squamous cell carcinoma of the Larynx, stage I (T1a N0 M0). He presented in July 2024 for hoarseness. He was found to have a right cord lesion. On 06/05/23 He underwent a microlaryngoscopy with biopsy of vocal cord lesion by Dr. Jesus. Surgical pathology at that time revealed squamous mucosa with moderate dysplasia in a background of chronic inflammation. He again presented to Dr. Jesus on 04/16/24 with a one month history of voice hoarseness. Laryngoscopy revealed a white plaque like lesion involving the right mid vocal cord. He was treated with aggressive antireflux treatment without relief. 05/15/24 he underwent a microlaryngoscopy and excisional biopsy of the right vocal cord under the care of Dr. Jesus. The lesion was pale, papillary and extended from approximately 30% anterior to the posterior cord, extending until approximately 1 or 2 mm posterior to the anterior commissure, superiorly to the superior mucosal surface of the cord but not extending into the ventricle, and inferiorly to just below the contacting surface of the cord. Surgical pathology indicated well to moderately differentiated invasive squamous cell carcinoma.  05/21/24 Consult with Dr. Izell. He will receive radiation only. Treatment plan: He will receive 28 fractions of radiation to his glottis. Which started on 06/03/24 and complete 07/11/24.  PAIN:  Are you having pain? No  FALLS:  Has patient fallen in last 6 months?  No   PATIENT GOALS: Maintain WNL swallowing  OBJECTIVE:  Note: Objective measures were completed at Evaluation unless otherwise noted.                                                                                                                            TREATMENT DATE:   07/11/24: Pt eating desired diet, just chews more thoroughly and takes smaller bites. Today a sandwich and water appeared WNL  without overt s/sx oropharyngeal dysphagia.  Pt has not been completing HEP last two weeks He told SLP rationale with cues from SLP so SLP reiterated rationale for this. Pt demonstrated understanding. Today he req'd rare min A for tongue protrusion with [place].  06/13/24: Research states the risk for dysphagia increases due to radiation and/or chemotherapy treatment due to a variety of factors, so SLP educated the pt about the possibility of reduced/limited ability for PO intake during rad tx. SLP also educated pt regarding possible changes to swallowing musculature after rad tx, and why adherence to dysphagia HEP provided today and PO consumption was necessary to inhibit muscle fibrosis following rad tx. SLP informed pt why this would be detrimental to their swallowing status and to their pulmonary health. Pt demonstrated understanding of these things to SLP. SLP encouraged pt to safely eat and drink as deep into their radiation/chemotherapy as possible to provide the best possible long-term swallowing outcome for pt. SLP then developed an individualized HEP for pt involving oral and pharyngeal and vocal  strengthening and ROM and pt was instructed how to perform these exercises, including SLP demonstration. After SLP demonstration, pt return demonstrated each exercise. SLP ensured pt performance was correct prior to educating pt on next exercise. Pt required usual mod cues faded to modified independent to perform HEP. Pt was instructed to complete this program 5-7 days/week, at least 20 reps a day until 6 months after his last day of rad tx, and then x2 a week after that, indefinitely.   PATIENT EDUCATION: Education details: late effects head/neck radiation on swallow function and HEP procedure Person educated: Patient and Spouse Education method: Explanation, Demonstration, Verbal cues, and Handouts Education comprehension: verbalized understanding, returned demonstration, verbal cues required, and needs  further education   ASSESSMENT:  CLINICAL IMPRESSION: Patient is a 68 y.o. M who was seen today for treatment of swallowing as they undergo radiation therapy. Today pt ate  [place] sandwich and drank thin liquids without overt s/s oral or pharyngeal difficulty. He has had not s/sx oropharygneal dysphagia and is modifying procedure for POs by chewing more thoroughly and by taking smaller bites. At this time pt swallowing is deemed WNL/WFL with these POs. No oral or overt s/sx pharyngeal deficits, including aspiration were observed. There are no overt s/s aspiration PNA observed by SLP nor any reported by pt at this time. Data indicate that pt's swallow ability will likely decrease over the course of radiation/chemoradiation therapy and  could very well decline over time following the conclusion of that therapy due to muscle disuse atrophy and/or muscle fibrosis. Pt will cont to need to be seen by SLP in order to assess safety of PO intake, assess the need for recommending any objective swallow assessment, and ensuring pt is correctly completing the individualized HEP.  OBJECTIVE IMPAIRMENTS: include dysphagia and hoarseness. These impairments are limiting patient from household responsibilities, ADLs/IADLs, effectively communicating at home and in community, and safety when swallowing. Factors affecting potential to achieve goals and functional outcome are none noted today. Patient will benefit from skilled SLP services to address above impairments and improve overall function.  REHAB POTENTIAL: Good   GOALS: Goals reviewed with patient? No SHORT TERM GOALS: Target: 3rd total session   1. Pt will complete HEP with modified independence in 2 sessions Baseline: Goal status: INITIAL   2.  pt will tell SLP why pt is completing HEP with modified independence Baseline:  Goal status: INITIAL   3.  pt will describe 3 overt s/s aspiration PNA with modified independence Baseline:  Goal status:  INITIAL   4.  pt will tell SLP how a food journal could hasten return to a more normalized diet Baseline:  Goal status: deferred due to pt eating desired diet     LONG TERM GOALS: Target: 7th total session   1.  pt will complete HEP with independence over two visits Baseline:  Goal status: INITIAL   2.  pt will describe how to modify HEP over time, and the timeline associated with reduction in HEP frequency with modified independence over two sessions Baseline:  Goal status: INITIAL     PLAN:   SLP FREQUENCY:  once approx every 4 weeks   SLP DURATION:  7 sessions   PLANNED INTERVENTIONS: Aspiration precaution training, Pharyngeal strengthening exercises, Diet toleration management , Trials of upgraded texture/liquids, SLP instruction and feedback, Compensatory strategies, and Patient/family education    [facility], CCC-SLP 07/11/2024, 10:46 AM

## 2024-07-11 NOTE — Progress Notes (Signed)
 Oncology Nurse Navigator Documentation   Met with Cameron Campbell and his wife after final RT to offer support and to celebrate end of radiation treatment.   Provided verbal/written post-RT guidance: Importance of keeping all follow-up appts, especially those with Nutrition and SLP. Importance of protecting treatment area from sun. Continuation of Sonafine application 2-3 times daily, application of antibiotic ointment to areas of raw skin; when supply of Sonafine exhausted transition to OTC lotion with vitamin E.  Explained my role as navigator will continue for several more months, encouraged him to call me with needs/concerns.    [name] RN, BSN, OCN Head & Neck Oncology Nurse Navigator [facility] Phone # [phone]  Fax # [phone]

## 2024-07-26 NOTE — Progress Notes (Signed)
 Radiation Oncology         [phone] ________________________________  Name: Cameron Campbell MRN: [id number]  Date: 07/30/2024  DOB: 1956-07-19  Follow-Up Visit Note  CC: Elliot Charm, MD  Elliot Charm,*  Diagnosis and Prior Radiotherapy:       ICD-10-CM   1. Cancer of glottis (HCC)  C32.0 TSH      ==========DELIVERED PLANS==========  First Treatment Date: 2024-06-03 Last Treatment Date: 2024-07-11   Plan Name: HN_Glottis Site: Larynx Technique: 3D Mode: Photon Dose Per Fraction: 2.25 Gy Prescribed Dose (Delivered / Prescribed): 63 Gy / 63 Gy Prescribed Fxs (Delivered / Prescribed): 28 / 28   Cancer Staging  Cancer of glottis (HCC) Staging form: Larynx - Glottis, AJCC 8th Edition - Clinical stage from 05/21/2024: Stage I (cT1a, cN0, cM0) - Signed by Izell Domino, MD on 05/21/2024 Stage prefix: Initial diagnosis  Stage 1 (cT1a, N0, M0) squamous cell carcinoma of the larynx; s/p definitive radiation completed on 07/11/2024   CHIEF COMPLAINT:  Here for follow-up and surveillance of laryngeal cancer  Narrative:  The patient returns today for routine follow-up. He completed his treatment on 07/11/2024.    Pain issues, if any: Denies pain Using a feeding tube?: N/A Weight changes, if any: Stable Wt Readings from Last 3 Encounters:  07/30/24 176 lb 12.8 oz (80.2 kg)  06/04/24 176 lb (79.8 kg)  05/21/24 175 lb 4 oz (79.5 kg)  Swallowing issues, if any: Denies Smoking or chewing tobacco? Denies Using fluoride toothpaste daily? No Last ENT visit was on:  05/17/24 Dr. [name] (prior to treatment) Other notable issues, if applicable: He notes improving fatigue and is concerned about the hoarseness.                     ALLERGIES:  has no known allergies.  Meds: Current Outpatient Medications  Medication Sig Dispense Refill   atorvastatin (LIPITOR) 20 MG tablet Take 10 mg by mouth at bedtime.     omeprazole (PRILOSEC) 20 MG capsule Take 20 mg by  mouth daily.     lidocaine  (XYLOCAINE ) 2 % solution Patient: Mix 1part 2% viscous lidocaine , 1part H20. Swallow 10mL of diluted mixture, before meals and at bedtime, up to QID (Patient not taking: Reported on 07/30/2024) 200 mL 3   No current facility-administered medications for this encounter.    Physical Findings: The patient is in no acute distress. Patient is alert and oriented. Wt Readings from Last 3 Encounters:  07/30/24 176 lb 12.8 oz (80.2 kg)  06/04/24 176 lb (79.8 kg)  05/21/24 175 lb 4 oz (79.5 kg)    height is 5' 10 (1.778 m) and weight is 176 lb 12.8 oz (80.2 kg). His oral temperature is 97.8 F (36.6 C). His blood pressure is 119/67 and his pulse is 64. His respiration is 16 and oxygen saturation is 98%. .  General: Alert and oriented, in no acute distress HEENT: Head is normocephalic. Extraocular movements are intact. Oropharynx is notable for no concerning lesions. Uvula midline. No mucositis changes appreciated. Good dentition.  Neck: Neck is notable for no palpable masses or adenopathy. Skin: Skin in treatment fields shows erythema and mild dry desquamation within the treatment file Heart: Regular in rate and rhythm with no murmurs, rubs, or gallops. Chest: Clear to auscultation bilaterally, with no rhonchi, wheezes, or rales. Abdomen: Soft, nontender, nondistended, with no rigidity or guarding. Extremities: No cyanosis or edema. Lymphatics: see Neck Exam Psychiatric: Judgment and insight are intact. Affect is  appropriate.   Lab Findings: Lab Results  Component Value Date   WBC 4.4 05/15/2024   HGB 13.6 05/15/2024   HCT 41.4 05/15/2024   MCV 95.2 05/15/2024   PLT 187 05/15/2024    Lab Results  Component Value Date   TSH 1.650 06/03/2024    Radiographic Findings: No results found.  Impression/Plan:  Stage 1 (cT1a, N0, M0) squamous cell carcinoma of the larynx; s.p definitive radiation completed on 07/11/2024   1) Head and Neck Cancer Status:  Patient is healing very well from his radiation treatments.   We discussed natural course of healing. We would expect improvement in voice hoarseness in the upcoming months.   2) Nutritional Status: Stable Wt Readings from Last 3 Encounters:  07/30/24 176 lb 12.8 oz (80.2 kg)  06/04/24 176 lb (79.8 kg)  05/21/24 175 lb 4 oz (79.5 kg)  PEG tube: N/A  3) Risk Factors: The patient has been educated about risk factors including alcohol and tobacco abuse; they understand that avoidance of alcohol and tobacco is important to prevent recurrences as well as other cancers. Patient is a non-smoker.   4) Swallowing: Denies difficulties. He is following with SLP and scheduled to see Lupita next on 08/19/2024.   5) Dental: Encouraged to continue regular followup with dentistry, and dental hygiene including fluoride rinses. Patient sees his dentist at least twice/year.   6) Thyroid  function: Checking annually. We will check this at his next appointment in 5 months.  Lab Results  Component Value Date   TSH 1.650 06/03/2024   7) Follow-up with ENT in 2 months. We will see the patient in 5 months for laryngoscope at that time. The patient was encouraged to call with any issues or questions before then.   On date of service, in total, I spent 30 minutes on this encounter. Patient was seen in person. _____________________________________    [name], PA-C

## 2024-07-29 NOTE — Progress Notes (Signed)
 Patient identity verified x2  Mr. Cameron Campbell returns today for routine follow-up to see [name] PA-C. He completed his treatment on 07/11/24.   Pain issues, if any: Denies pain Using a feeding tube?: No Weight changes, if any: He has maintained his weight throughout treatment and weighed   pounds today.  Wt Readings from Last 3 Encounters: 07/30/24        176.8 lb   06/04/24 176 lb (79.8 kg)  05/21/24 175 lb 4 oz (79.5 kg)   Swallowing issues, if any: Denies Smoking or chewing tobacco? Denies Using fluoride toothpaste daily? No Last ENT visit was on:  05/17/24 Dr. [name] Other notable issues, if  He is concerned about the hoarseness.  BP 119/67 (BP Location: Left Arm, Patient Position: Sitting)   Pulse 64   Temp 97.8 F (36.6 C) (Oral)   Resp 16   Ht 5' 10 (1.778 m)   Wt 176 lb 12.8 oz (80.2 kg)   SpO2 98%   BMI 25.37 kg/m

## 2024-07-30 ENCOUNTER — Encounter: Payer: Self-pay | Admitting: Radiology

## 2024-07-30 ENCOUNTER — Ambulatory Visit
Admission: RE | Admit: 2024-07-30 | Discharge: 2024-07-30 | Disposition: A | Source: Ambulatory Visit | Attending: Radiology | Admitting: Radiology

## 2024-07-30 VITALS — BP 119/67 | HR 64 | Temp 97.8°F | Resp 16 | Ht 70.0 in | Wt 176.8 lb

## 2024-07-30 DIAGNOSIS — C320 Malignant neoplasm of glottis: Secondary | ICD-10-CM | POA: Diagnosis not present

## 2024-07-30 DIAGNOSIS — Z923 Personal history of irradiation: Secondary | ICD-10-CM | POA: Diagnosis not present

## 2024-07-30 DIAGNOSIS — Z79899 Other long term (current) drug therapy: Secondary | ICD-10-CM | POA: Diagnosis not present

## 2024-07-30 NOTE — Progress Notes (Signed)
 Patient Name: Cameron Campbell, STANDAGE MRN: [id number] Date of Birth: 1956-03-24 Referring Physician: [name], M.D. Date of Service: 2024-07-30 Radiation Oncologist: Lauraine Golden, M.D. [facility] [place]                             RADIATION ONCOLOGY END OF TREATMENT NOTE     Diagnosis: C32.0 Malignant neoplasm of glottis Staging on 2024-05-21: Cancer of glottis (HCC) T=cT1a, N=cN0, M=cM0 Intent: Curative     ==========DELIVERED PLANS==========  First Treatment Date: 2024-06-03 Last Treatment Date: 2024-07-11   Plan Name: HN_Glottis Site: Larynx Technique: 3D Mode: Photon Dose Per Fraction: 2.25 Gy Prescribed Dose (Delivered / Prescribed): 63 Gy / 63 Gy Prescribed Fxs (Delivered / Prescribed): 28 / 28     ==========ON TREATMENT VISIT DATES========== 2024-06-03, 2024-06-10, 2024-06-17, 2024-06-25, 2024-07-01, 2024-07-08, 2024-07-10     ==========UPCOMING VISITS========== 08/19/2024 [facility] TREATMENT Jacelyn Lupita NOVAK, CCC-SLP  07/30/2024 [facility]-RADIATION ONC FOLLOW UP 20 Wyatt Czar M, PA-C        ==========APPENDIX - ON TREATMENT VISIT NOTES==========   See weekly On Treatment Notes in Epic for details in the Media tab (listed as Progress notes on the On Treatment Visit Dates listed above).

## 2024-07-30 NOTE — Progress Notes (Signed)
 Oncology Nurse Navigator Documentation   Per patient's 07/30/24 post-treatment follow-up with Dr. Izell, sent fax to [facility] ENT Scheduling with request [name] be contacted and scheduled for routine post-RT follow-up with Dr. Jesus in 2 months.  Notification of successful fax transmission received.   [name] RN, BSN, OCN Head & Neck Oncology Nurse Navigator [facility] Phone # [phone]  Fax # [phone]

## 2024-08-19 ENCOUNTER — Ambulatory Visit: Attending: Radiation Oncology

## 2024-08-19 DIAGNOSIS — R1310 Dysphagia, unspecified: Secondary | ICD-10-CM | POA: Diagnosis not present

## 2024-08-19 NOTE — Therapy (Signed)
 OUTPATIENT SPEECH LANGUAGE PATHOLOGY ONCOLOGY TREATMENT/recertification   Patient Name: Cameron Campbell MRN: [id number] DOB:26-May-1956, 68 y.o., male Today's Date: 08/19/2024  PCP: Elliot SAUNDERS., MD REFERRING PROVIDER: Izell Domino, MD  END OF SESSION:  End of Session - 08/19/24 0831     Visit Number 3    Number of Visits 5    Date for Recertification  11/17/24    SLP Start Time 0803    SLP Stop Time  0839    SLP Time Calculation (min) 36 min    Activity Tolerance Patient tolerated treatment well           Past Medical History:  Diagnosis Date   Abnormal prostate biopsy    negative bx 2003   Achilles tendinitis    BPPV (benign paroxysmal positional vertigo) 10/2007   Cancer (HCC)    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   Elevated LFTs    Genital herpes    GERD (gastroesophageal reflux disease)    HLD (hyperlipidemia)    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   Lumbar radiculopathy    Onychomycosis    Past Surgical History:  Procedure Laterality Date   APPENDECTOMY     COLONOSCOPY WITH PROPOFOL  N/A 11/15/2016   Procedure: COLONOSCOPY WITH PROPOFOL ;  Surgeon: Gladis MARLA Louder, MD;  Location: [facility];  Service: Endoscopy;  Laterality: N/A;   MICROLARYNGOSCOPY Right 06/05/2023   Procedure: Microlaryngoscopy with Excision of Vocal Cord Lesion;  Surgeon: Jesus Oliphant, MD;  Location:  [facility];  Service: ENT;  Laterality: Right;   MICROLARYNGOSCOPY WITH LASER N/A 05/15/2024   Procedure: MICROLARYNGOSCOPY WITH BIOPSY;  Surgeon: Jesus Oliphant, MD;  Location: [facility] OR;  Service: ENT;  Laterality: N/A;   Patient Active Problem List   Diagnosis Date Noted   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   Objective: See below.  Goal Update: See  below.  Plan: See pt for 1-2 more sessions.  Reason Skilled Services are Required: SLP needs to ensure progress continues over time.   ONSET DATE: See below - script 05/21/24  REFERRING DIAG: Cancer of glottis  THERAPY DIAG:  No diagnosis found.  Rationale for Evaluation and Treatment: Rehabilitation  SUBJECTIVE:   SUBJECTIVE STATEMENT: Can you hear me talk? I talked for about an hour and a half with my brother last night.  Pt accompanied by: self  PERTINENT HISTORY:  Squamous cell carcinoma of the Larynx, stage I (T1a N0 M0). He presented in July 2024 for hoarseness. He was found to have a right cord lesion. On 06/05/23 He underwent a microlaryngoscopy with biopsy of vocal cord lesion by Dr. Jesus. Surgical pathology at that time revealed squamous mucosa with moderate dysplasia in a background of chronic inflammation. He again presented to Dr. Jesus on 04/16/24 with a one month history of voice hoarseness. Laryngoscopy revealed a white plaque like lesion involving the right mid vocal cord. He was treated with aggressive antireflux treatment without relief. 05/15/24 he underwent a microlaryngoscopy and excisional biopsy of the right vocal cord under the care of Dr. Jesus. The lesion was pale, papillary and extended from approximately 30% anterior to the posterior cord, extending until approximately 1 or 2 mm posterior to the anterior commissure, superiorly to the superior mucosal surface of the cord but not extending  into the ventricle, and inferiorly to just below the contacting surface of the cord. Surgical pathology indicated well to moderately differentiated invasive squamous cell carcinoma.  05/21/24 Consult with Dr. Izell. He will receive radiation only. Treatment plan: He will receive 28 fractions of radiation to his glottis. Which started on 06/03/24 and complete 07/11/24.  PAIN:  Are you having pain? No  FALLS: Has patient fallen in last 6 months?  No   PATIENT GOALS: Maintain  WNL swallowing  OBJECTIVE:  Note: Objective measures were completed at Evaluation unless otherwise noted.                                                                                                                            TREATMENT DATE:   08/19/24: It seems like a week or two ago saliva would go down the wrong way but it seems better now. Pt ate peanut butter crackers and drank water without overt s/sx oropharyngeal dysphagia. HEP has been completed with suboptimal reps until about two weeks ago. Pt told SLP rationale for HEP, SLP told pt to complete more like 30 reps/day of each exercise. SLP educated pt about timeframe to reduce frequency of HEP (mid-March 2026). SLP educated pt about s/sx aspiration PNA - pt repeated these to SLP with mod I.   07/11/24: Pt eating desired diet, just chews more thoroughly and takes smaller bites. Today a sandwich and water appeared WNL without overt s/sx oropharyngeal dysphagia.  Pt has not been completing HEP last two weeks He told SLP rationale with cues from SLP so SLP reiterated rationale for this. Pt demonstrated understanding. Today he req'd rare min A for tongue protrusion with [place].  06/13/24: Research states the risk for dysphagia increases due to radiation and/or chemotherapy treatment due to a variety of factors, so SLP educated the pt about the possibility of reduced/limited ability for PO intake during rad tx. SLP also educated pt regarding possible changes to swallowing musculature after rad tx, and why adherence to dysphagia HEP provided today and PO consumption was necessary to inhibit muscle fibrosis following rad tx. SLP informed pt why this would be detrimental to their swallowing status and to their pulmonary health. Pt demonstrated understanding of these things to SLP. SLP encouraged pt to safely eat and drink as deep into their radiation/chemotherapy as possible to provide the best possible long-term swallowing outcome for pt. SLP then  developed an individualized HEP for pt involving oral and pharyngeal and vocal  strengthening and ROM and pt was instructed how to perform these exercises, including SLP demonstration. After SLP demonstration, pt return demonstrated each exercise. SLP ensured pt performance was correct prior to educating pt on next exercise. Pt required usual mod cues faded to modified independent to perform HEP. Pt was instructed to complete this program 5-7 days/week, at least 20 reps a day until 6 months after his last day of rad tx, and then x2 a week after that, indefinitely.   PATIENT  EDUCATION: Education details: late effects head/neck radiation on swallow function and HEP procedure Person educated: Patient and Spouse Education method: Explanation, Demonstration, Verbal cues, and Handouts Education comprehension: verbalized understanding, returned demonstration, verbal cues required, and needs further education   ASSESSMENT:  CLINICAL IMPRESSION: Patient is a 68 y.o. M who was seen today for treatment of swallowing after radiation therapy. Today pt ate  crackers/chips and drank thin liquids without overt s/s oral or pharyngeal difficulty. He has had not s/sx oropharygneal dysphagia and is modifying procedure for POs by chewing more thoroughly and by taking smaller bites. At this time pt swallowing is deemed WNL/WFL with these POs. No oral or overt s/sx pharyngeal deficits, including aspiration were observed today. There are no overt s/s aspiration PNA observed by SLP nor any reported by pt at this time. Data indicate that pt's swallow ability will likely decrease over the course of radiation/chemoradiation therapy and could very well decline over time following the conclusion of that therapy due to muscle disuse atrophy and/or muscle fibrosis. Pt will cont to need to be seen by SLP in order to assess safety of PO intake, assess the need for recommending any objective swallow assessment, and ensuring pt is  correctly completing the individualized HEP.  OBJECTIVE IMPAIRMENTS: include dysphagia and hoarseness. These impairments are limiting patient from household responsibilities, ADLs/IADLs, effectively communicating at home and in community, and safety when swallowing. Factors affecting potential to achieve goals and functional outcome are none noted today. Patient will benefit from skilled SLP services to address above impairments and improve overall function.  REHAB POTENTIAL: Good   GOALS: Goals reviewed with patient? No SHORT TERM GOALS: Target: 3rd total session   1. Pt will complete HEP with modified independence in 2 sessions Baseline:08/19/24 Goal status: partially met   2.  pt will tell SLP why pt is completing HEP with modified independence Baseline:  Goal status: met   3.  pt will describe 3 overt s/s aspiration PNA with modified independence Baseline:  Goal status: met   4.  pt will tell SLP how a food journal could hasten return to a more normalized diet Baseline:  Goal status: deferred due to pt eating desired diet     LONG TERM GOALS: Target: 7th total session   1.  pt will complete HEP with independence over two visits Baseline:  Goal status: INITIAL   2.  pt will describe how to modify HEP over time, and the timeline associated with reduction in HEP frequency with modified independence over two sessions Baseline:  Goal status: INITIAL     PLAN:   SLP FREQUENCY:  once approx every 8 weeks   SLP DURATION:  7 sessions   PLANNED INTERVENTIONS: Aspiration precaution training, Pharyngeal strengthening exercises, Diet toleration management , Trials of upgraded texture/liquids, SLP instruction and feedback, Compensatory strategies, and Patient/family education    [facility], CCC-SLP 08/19/2024, 8:39 AM

## 2024-08-19 NOTE — Patient Instructions (Signed)
   Signs of Aspiration Pneumonia   Chest pain/tightness Fever (can be low grade) Cough  With foul-smelling phlegm (sputum) With sputum containing pus or blood With greenish sputum Fatigue  Shortness of breath  Wheezing   **IF YOU HAVE THESE SIGNS, CONTACT YOUR DOCTOR OR GO TO THE EMERGENCY DEPARTMENT OR URGENT CARE AS SOON AS POSSIBLE**

## 2024-08-29 DIAGNOSIS — Z923 Personal history of irradiation: Secondary | ICD-10-CM | POA: Diagnosis not present

## 2024-08-29 DIAGNOSIS — C329 Malignant neoplasm of larynx, unspecified: Secondary | ICD-10-CM | POA: Diagnosis not present

## 2024-08-29 NOTE — Progress Notes (Signed)
 Return visit.  Cancer site: Right vocal cord Stage: T1 N0 Treatment type: XRT Date of treatment: Completed September 2025 Smoking status: Never   HPI:    Cameron Campbell is a 68 y.o. male who presents as a return Patient.    Current problem: Laryngeal cancer.  HPI: Recently finished radiation therapy, doing well overall.  PMH/Meds/All/SocHx/FamHx/ROS:   Medical History[1]  Surgical History[2]  No family history of bleeding disorders, wound healing problems or difficulty with anesthesia.      Current Medications[3]      Physical Exam:      Healthy-appearing in no distress.  Breathing and voice are clear.  Nasal exam is unremarkable.  Oral cavity and pharynx are healthy and clear.  Indirect laryngoscopy reveals normal cord mobility without any mucosal lesions.  There is erythema of the right cord.  No palpable adenopathy.   Independent Review of Additional Tests or Records:  none  Procedures:  none   Impression & Plans:  Stable, no evidence of persistent disease.  Follow-up 3 months or sooner as needed.  Medical Decision Making: #/Compl Problems  2 Data Rev  1  Management 2  (1-Straightforward, 2-Low, 3- Moderate, 4-High)   Ida VEAR Loader, MD        [1] History reviewed. No pertinent past medical history. [2] History reviewed. No pertinent surgical history. [3]  Current Outpatient Medications:    atorvastatin (LIPITOR) 10 mg tablet, take one tablet at bedtime. by mouth once a day for 90 days, Disp: , Rfl:    omeprazole (PriLOSEC) 20 mg DR capsule, Take 1 capsule (20 mg total) by mouth in the morning. Take first thing in the morning.  Do not eat or drink for 1 hour before or after taking pill., Disp: 90 capsule, Rfl: 3

## 2024-10-08 DIAGNOSIS — Z923 Personal history of irradiation: Secondary | ICD-10-CM | POA: Diagnosis not present

## 2024-10-08 DIAGNOSIS — C329 Malignant neoplasm of larynx, unspecified: Secondary | ICD-10-CM | POA: Diagnosis not present

## 2024-10-08 NOTE — Progress Notes (Signed)
 Return visit.   Cancer site: Right vocal cord Stage: T1 N0 Treatment type: XRT Date of treatment: Completed September 2025 Smoking status: Never  Otolaryngology Office Note  HPI:    Cameron Campbell is a 68 y.o. male who presents as a return Patient.    Current problem: Laryngeal cancer.  HPI: Doing well overall, intermittent sore throats.  PMH/Meds/All/SocHx/FamHx/ROS:   Medical History[1]  Surgical History[2]  No family history of bleeding disorders, wound healing problems or difficulty with anesthesia.   Social Connections: Not on file    Current Medications[3]      Physical Exam:      Healthy-appearing no distress.  Breathing and voice are clear.  Nasal exam is unremarkable.  Oral cavity and pharynx are healthy and clear.  Dentition in great shape.  Indirect exam reveals normal vocal cord mobility.  No mucosal lesions identified in the larynx or hypopharynx.  Cords with some injection as expected from radiation.  No palpable adenopathy.   Independent Review of Additional Tests or Records:  none  Procedures:  none   Impression & Plans:  Stable, no evidence of recurrent disease.  Recheck 3 months or sooner as needed.  He would like to come off the Prilosec I think that is reasonable.  He can always use it as needed.  Follow-up next visit.  Medical Decision Making: #/Compl Problems  2 Data Rev  1  Management 2  (1-Straightforward, 2-Low, 3- Moderate, 4-High)   Ida VEAR Loader, MD        [1] History reviewed. No pertinent past medical history. [2] Past Surgical History: Procedure Laterality Date   THROAT SURGERY  06/05/2023   mircrolaryngoscopy  [3]  Current Outpatient Medications:    atorvastatin (LIPITOR) 10 mg tablet, take one tablet at bedtime. by mouth once a day for 90 days, Disp: , Rfl:    omeprazole (PriLOSEC) 20 mg DR capsule, Take 1 capsule (20 mg total) by mouth in the morning. Take first thing in the morning.  Do not eat or drink  for 1 hour before or after taking pill. (Patient taking differently: Take 20 mg by mouth as needed. Take first thing in the morning.  Do not eat or drink for 1 hour before or after taking pill.), Disp: 90 capsule, Rfl: 3

## 2024-10-11 ENCOUNTER — Ambulatory Visit: Attending: Radiation Oncology

## 2024-10-11 DIAGNOSIS — R1310 Dysphagia, unspecified: Secondary | ICD-10-CM | POA: Diagnosis present

## 2024-10-11 DIAGNOSIS — R490 Dysphonia: Secondary | ICD-10-CM | POA: Insufficient documentation

## 2024-10-11 NOTE — Patient Instructions (Signed)
" ° ° ° ° °  CONTINUE TO DO YOUR EXERCISES x5 days/week UNTIL APRIL 15, and THEN TWICE A WEEK AFTER THAT   Signs you may have developed trouble with your swallowing:  Items you used to eat or drink well,  you now have difficulty passing through the throat or are choking or coughing when you eat or drink them  You are clearing your throat often when you are eating or drinking  You have a wet voice when you are eating or drinking  If you have one or more of these signs contact your ENT or Dr. Izell (if she is still following you)  "

## 2024-10-11 NOTE — Therapy (Signed)
 " OUTPATIENT SPEECH LANGUAGE PATHOLOGY ONCOLOGY TREATMENT/discharge summary   Patient Name: Cameron Campbell MRN: [id number] DOB:08-26-1956, 68 y.o., male Today's Date: 10/11/2024  PCP: Elliot SAUNDERS., MD REFERRING PROVIDER: Izell Domino, MD  END OF SESSION:  End of Session - 10/11/24 0836     Visit Number 4    Number of Visits 5    Date for Recertification  11/17/24    SLP Start Time 0809   pt checked in 0808   SLP Stop Time  0838    SLP Time Calculation (min) 29 min    Activity Tolerance Patient tolerated treatment well            Past Medical History:  Diagnosis Date   Abnormal prostate biopsy    negative bx 2003   Achilles tendinitis    BPPV (benign paroxysmal positional vertigo) 10/2007   Cancer (HCC)    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   Elevated LFTs    Genital herpes    GERD (gastroesophageal reflux disease)    HLD (hyperlipidemia)    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   Lumbar radiculopathy    Onychomycosis    Past Surgical History:  Procedure Laterality Date   APPENDECTOMY     COLONOSCOPY WITH PROPOFOL  N/A 11/15/2016   Procedure: COLONOSCOPY WITH PROPOFOL ;  Surgeon: Gladis MARLA Louder, MD;  Location: [facility];  Service: Endoscopy;  Laterality: N/A;   MICROLARYNGOSCOPY Right 06/05/2023   Procedure: Microlaryngoscopy with Excision of Vocal Cord Lesion;  Surgeon: Jesus Oliphant, MD;  Location: [facility];  Service: ENT;  Laterality: Right;   MICROLARYNGOSCOPY WITH LASER N/A 05/15/2024   Procedure: MICROLARYNGOSCOPY WITH BIOPSY;  Surgeon: Jesus Oliphant, MD;  Location: [facility] OR;  Service: ENT;  Laterality: N/A;   Patient Active Problem List   Diagnosis Date Noted   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 Pt is eating now without overt s/sx oropharyngeal dysphagia, and can complete HEP  independently.   Remaining deficits: None noted.   Education / Equipment: See therapy session  notes.   Patient agrees to discharge. Patient goals were partially met. Patient is being discharged due to being pleased with the current functional level.. .   ONSET DATE: See below - script 05/21/24  REFERRING DIAG: Cancer of glottis  THERAPY DIAG:  Dysphagia, unspecified type  Hoarseness  Rationale for Evaluation and Treatment: Rehabilitation  SUBJECTIVE:   SUBJECTIVE STATEMENT: It's pretty normal (with eating).  Pt accompanied by: self  PERTINENT HISTORY:  Squamous cell carcinoma of the Larynx, stage I (T1a N0 M0). He presented in July 2024 for hoarseness. He was found to have a right cord lesion. On 06/05/23 He underwent a microlaryngoscopy with biopsy of vocal cord lesion by Dr. Jesus. Surgical pathology at that time revealed squamous mucosa with moderate dysplasia in a background of chronic inflammation. He again presented to Dr. Jesus on 04/16/24 with a one month history of voice hoarseness. Laryngoscopy revealed a white plaque like lesion involving the right mid vocal cord. He was treated with aggressive antireflux treatment without relief. 05/15/24 he underwent a microlaryngoscopy and excisional biopsy of the right vocal cord under the care of Dr. Jesus. The lesion was pale, papillary and extended from approximately 30% anterior to the posterior cord, extending until approximately 1 or 2 mm posterior to the anterior commissure, superiorly to  the superior mucosal surface of the cord but not extending into the ventricle, and inferiorly to just below the contacting surface of the cord. Surgical pathology indicated well to moderately differentiated invasive squamous cell carcinoma.  05/21/24 Consult with Dr. Izell. He will receive radiation only. Treatment plan: He will receive 28 fractions of radiation to his glottis. Which started on 06/03/24 and complete 07/11/24.  PAIN:  Are you  having pain? No  FALLS: Has patient fallen in last 6 months?  No   PATIENT GOALS: Maintain WNL swallowing  OBJECTIVE:  Note: Objective measures were completed at Evaluation unless otherwise noted.                                                                                                                            TREATMENT DATE:   10/11/24: Pt had sore throat lingering so went to ENT and said it was nothing of concern. Pt ate peanut butter crackers and water without any signs of oropharyngeal dysphagia. Procedure looked WNL without SLP cues. SLP told pt to cont HEP at 30 reps/day x5 days/week until February 05, 2025 , then x2/week.   08/19/24: It seems like a week or two ago saliva would go down the wrong way but it seems better now. Pt ate peanut butter crackers and drank water without overt s/sx oropharyngeal dysphagia. HEP has been completed with suboptimal reps until about two weeks ago. Pt told SLP rationale for HEP, SLP told pt to complete more like 30 reps/day of each exercise. SLP educated pt about timeframe to reduce frequency of HEP (mid-March 2026). SLP educated pt about s/sx aspiration PNA - pt repeated these to SLP with mod I.   07/11/24: Pt eating desired diet, just chews more thoroughly and takes smaller bites. Today a sandwich and water appeared WNL without overt s/sx oropharyngeal dysphagia.  Pt has not been completing HEP last two weeks He told SLP rationale with cues from SLP so SLP reiterated rationale for this. Pt demonstrated understanding. Today he req'd rare min A for tongue protrusion with [place].  06/13/24: Research states the risk for dysphagia increases due to radiation and/or chemotherapy treatment due to a variety of factors, so SLP educated the pt about the possibility of reduced/limited ability for PO intake during rad tx. SLP also educated pt regarding possible changes to swallowing musculature after rad tx, and why adherence to dysphagia HEP provided today and  PO consumption was necessary to inhibit muscle fibrosis following rad tx. SLP informed pt why this would be detrimental to their swallowing status and to their pulmonary health. Pt demonstrated understanding of these things to SLP. SLP encouraged pt to safely eat and drink as deep into their radiation/chemotherapy as possible to provide the best possible long-term swallowing outcome for pt. SLP then developed an individualized HEP for pt involving oral and pharyngeal and vocal  strengthening and ROM and pt was instructed how to perform these exercises, including SLP demonstration. After SLP demonstration,  pt return demonstrated each exercise. SLP ensured pt performance was correct prior to educating pt on next exercise. Pt required usual mod cues faded to modified independent to perform HEP. Pt was instructed to complete this program 5-7 days/week, at least 20 reps a day until 6 months after his last day of rad tx, and then x2 a week after that, indefinitely.   PATIENT EDUCATION: Education details: late effects head/neck radiation on swallow function and HEP procedure Person educated: Patient and Spouse Education method: Explanation, Demonstration, Verbal cues, and Handouts Education comprehension: verbalized understanding, returned demonstration, verbal cues required, and needs further education   ASSESSMENT:  CLINICAL IMPRESSION: Patient is a 68 y.o. M who was seen today for treatment of swallowing after radiation therapy. Today pt ate  crackers/chips and drank thin liquids without overt s/s oral or pharyngeal difficulty. At this time pt swallowing is deemed WNL/WFL with these POs. No oral or overt s/sx pharyngeal deficits, including aspiration were observed today. There are no overt s/s aspiration PNA observed by SLP nor any reported by pt at this time. Data indicate that pt's swallow ability will likely decrease over the course of radiation/chemoradiation therapy and could very well decline over time  following the conclusion of that therapy due to muscle disuse atrophy and/or muscle fibrosis. Pt will cont to need to be seen by SLP in order to assess safety of PO intake, assess the need for recommending any objective swallow assessment, and ensuring pt is correctly completing the individualized HEP.  OBJECTIVE IMPAIRMENTS: include dysphagia and hoarseness. These impairments are limiting patient from household responsibilities, ADLs/IADLs, effectively communicating at home and in community, and safety when swallowing. Factors affecting potential to achieve goals and functional outcome are none noted today. Patient will benefit from skilled SLP services to address above impairments and improve overall function.  REHAB POTENTIAL: Good   GOALS: Goals reviewed with patient? No SHORT TERM GOALS: Target: 3rd total session   1. Pt will complete HEP with modified independence in 2 sessions Baseline:08/19/24 Goal status: partially met   2.  pt will tell SLP why pt is completing HEP with modified independence Baseline:  Goal status: met   3.  pt will describe 3 overt s/s aspiration PNA with modified independence Baseline:  Goal status: met   4.  pt will tell SLP how a food journal could hasten return to a more normalized diet Baseline:  Goal status: deferred due to pt eating desired diet     LONG TERM GOALS: Target: 7th total session   1.  pt will complete HEP with independence over two visits Baseline: 10/11/24 Goal status: partially met   2.  pt will describe how to modify HEP over time, and the timeline associated with reduction in HEP frequency with modified independence over two sessions Baseline: 10/11/24 Goal status: partially met     PLAN:   SLP FREQUENCY:  once approx every 8 weeks   SLP DURATION:  7 sessions   PLANNED INTERVENTIONS: Aspiration precaution training, Pharyngeal strengthening exercises, Diet toleration management , Trials of upgraded texture/liquids, SLP  instruction and feedback, Compensatory strategies, and Patient/family education    [facility], CCC-SLP 10/11/2024, 9:14 AM      "

## 2024-10-21 ENCOUNTER — Ambulatory Visit

## 2024-12-31 ENCOUNTER — Ambulatory Visit: Admitting: Radiation Oncology

## 2024-12-31 ENCOUNTER — Ambulatory Visit: Payer: Self-pay | Admitting: Radiology

## 2024-12-31 ENCOUNTER — Ambulatory Visit

## 2025-01-07 ENCOUNTER — Ambulatory Visit: Attending: Radiation Oncology

## 2025-01-07 ENCOUNTER — Ambulatory Visit: Admitting: Radiation Oncology
